# Patient Record
Sex: Female | Born: 1955 | Race: White | Hispanic: No | Marital: Single | Smoking: Never smoker
Health system: Southern US, Community
[De-identification: ages and names within clinical notes are randomized; demographics above are authoritative.]

## PROBLEM LIST (undated history)

## (undated) DIAGNOSIS — J45909 Unspecified asthma, uncomplicated: Secondary | ICD-10-CM

## (undated) DIAGNOSIS — Z87898 Personal history of other specified conditions: Secondary | ICD-10-CM

## (undated) DIAGNOSIS — E119 Type 2 diabetes mellitus without complications: Secondary | ICD-10-CM

## (undated) DIAGNOSIS — J45990 Exercise induced bronchospasm: Secondary | ICD-10-CM

## (undated) DIAGNOSIS — Z859 Personal history of malignant neoplasm, unspecified: Secondary | ICD-10-CM

## (undated) DIAGNOSIS — Z973 Presence of spectacles and contact lenses: Secondary | ICD-10-CM

## (undated) DIAGNOSIS — E7800 Pure hypercholesterolemia, unspecified: Secondary | ICD-10-CM

## (undated) DIAGNOSIS — D259 Leiomyoma of uterus, unspecified: Secondary | ICD-10-CM

## (undated) DIAGNOSIS — N201 Calculus of ureter: Secondary | ICD-10-CM

## (undated) DIAGNOSIS — Z87820 Personal history of traumatic brain injury: Secondary | ICD-10-CM

## (undated) HISTORY — DX: Pure hypercholesterolemia, unspecified: E78.00

## (undated) HISTORY — DX: Unspecified asthma, uncomplicated: J45.909

## (undated) HISTORY — DX: Type 2 diabetes mellitus without complications: E11.9

---

## 2005-02-10 HISTORY — PX: OTHER SURGICAL HISTORY: SHX169

## 2007-07-13 ENCOUNTER — Encounter: Admission: RE | Admit: 2007-07-13 | Discharge: 2007-08-17 | Payer: Self-pay | Admitting: Orthopedic Surgery

## 2012-03-21 ENCOUNTER — Emergency Department (HOSPITAL_COMMUNITY): Payer: 59

## 2012-03-21 ENCOUNTER — Emergency Department (HOSPITAL_COMMUNITY): Payer: 59 | Admitting: Anesthesiology

## 2012-03-21 ENCOUNTER — Encounter (HOSPITAL_COMMUNITY): Payer: Self-pay | Admitting: Emergency Medicine

## 2012-03-21 ENCOUNTER — Encounter (HOSPITAL_COMMUNITY): Admission: EM | Disposition: A | Discharge status: Home / Self Care | Payer: Self-pay | Source: Home / Self Care

## 2012-03-21 ENCOUNTER — Emergency Department (HOSPITAL_COMMUNITY)
Admission: EM | Admit: 2012-03-21 | Discharge: 2012-03-21 | Disposition: A | Discharge status: Nursing Home (Includes ICF) | Payer: PRIVATE HEALTH INSURANCE | Source: Home / Self Care

## 2012-03-21 ENCOUNTER — Encounter (HOSPITAL_COMMUNITY): Payer: Self-pay | Admitting: Anesthesiology

## 2012-03-21 ENCOUNTER — Inpatient Hospital Stay (HOSPITAL_COMMUNITY)
Admission: EM | Admit: 2012-03-21 | Discharge: 2012-03-23 | DRG: 340 | Disposition: A | Discharge status: Home / Self Care | Payer: 59 | Attending: Surgery | Admitting: Surgery

## 2012-03-21 ENCOUNTER — Encounter (HOSPITAL_COMMUNITY): Payer: Self-pay | Admitting: Nurse Practitioner

## 2012-03-21 DIAGNOSIS — K3580 Unspecified acute appendicitis: Secondary | ICD-10-CM

## 2012-03-21 DIAGNOSIS — K352 Acute appendicitis with generalized peritonitis, without abscess: Principal | ICD-10-CM | POA: Diagnosis present

## 2012-03-21 DIAGNOSIS — K3532 Acute appendicitis with perforation and localized peritonitis, without abscess: Secondary | ICD-10-CM

## 2012-03-21 DIAGNOSIS — R1031 Right lower quadrant pain: Secondary | ICD-10-CM

## 2012-03-21 DIAGNOSIS — K35209 Acute appendicitis with generalized peritonitis, without abscess, unspecified as to perforation: Principal | ICD-10-CM | POA: Diagnosis present

## 2012-03-21 HISTORY — PX: LAPAROSCOPIC APPENDECTOMY: SHX408

## 2012-03-21 LAB — CBC WITH DIFFERENTIAL/PLATELET
Basophils Absolute: 0 10*3/uL (ref 0.0–0.1)
Basophils Relative: 0 % (ref 0–1)
MCHC: 34.9 g/dL (ref 30.0–36.0)
Monocytes Absolute: 1.1 10*3/uL — ABNORMAL HIGH (ref 0.1–1.0)
Neutro Abs: 18.7 10*3/uL — ABNORMAL HIGH (ref 1.7–7.7)
Neutrophils Relative %: 89 % — ABNORMAL HIGH (ref 43–77)
RDW: 13.2 % (ref 11.5–15.5)

## 2012-03-21 LAB — POCT URINALYSIS DIP (DEVICE)
Glucose, UA: NEGATIVE mg/dL
Specific Gravity, Urine: >=1.03 (ref 1.005–1.030)

## 2012-03-21 LAB — COMPREHENSIVE METABOLIC PANEL
Alkaline Phosphatase: 78 U/L (ref 39–117)
BUN: 18 mg/dL (ref 6–23)
Chloride: 99 mEq/L (ref 96–112)
GFR calc Af Amer: 79 mL/min — ABNORMAL LOW (ref >90)
GFR calc non Af Amer: 68 mL/min — ABNORMAL LOW (ref >90)
Glucose, Bld: 140 mg/dL — ABNORMAL HIGH (ref 70–99)
Potassium: 3.9 mEq/L (ref 3.5–5.1)
Total Bilirubin: 0.7 mg/dL (ref 0.3–1.2)

## 2012-03-21 LAB — LIPASE, BLOOD: Lipase: 17 U/L (ref 11–59)

## 2012-03-21 SURGERY — APPENDECTOMY, LAPAROSCOPIC
Anesthesia: General | Site: Abdomen | Wound class: Dirty or Infected

## 2012-03-21 MED ORDER — MIDAZOLAM HCL 5 MG/5ML IJ SOLN
INTRAMUSCULAR | Status: DC | PRN
  Administered 2012-03-21: 2 mg via INTRAVENOUS

## 2012-03-21 MED ORDER — KETOROLAC TROMETHAMINE 30 MG/ML IJ SOLN
INTRAMUSCULAR | Status: DC | PRN
  Administered 2012-03-21: 30 mg via INTRAVENOUS

## 2012-03-21 MED ORDER — SODIUM CHLORIDE 0.9 % IR SOLN
Status: DC | PRN
  Administered 2012-03-21: 1000 mL

## 2012-03-21 MED ORDER — KCL IN DEXTROSE-NACL 20-5-0.9 MEQ/L-%-% IV SOLN
INTRAVENOUS | Status: DC
  Administered 2012-03-21 – 2012-03-23 (×4): via INTRAVENOUS
  Filled 2012-03-21 (×6): qty 1000

## 2012-03-21 MED ORDER — LIDOCAINE HCL (CARDIAC) 20 MG/ML IV SOLN
INTRAVENOUS | Status: DC | PRN
  Administered 2012-03-21: 100 mg via INTRAVENOUS

## 2012-03-21 MED ORDER — SUCCINYLCHOLINE CHLORIDE 20 MG/ML IJ SOLN
INTRAMUSCULAR | Status: DC | PRN
  Administered 2012-03-21: 120 mg via INTRAVENOUS

## 2012-03-21 MED ORDER — ONDANSETRON HCL 4 MG/2ML IJ SOLN: 4.0000 mg | Freq: Four times a day (QID) | INTRAMUSCULAR | Status: DC | PRN

## 2012-03-21 MED ORDER — BUPIVACAINE-EPINEPHRINE 0.25% -1:200000 IJ SOLN
INTRAMUSCULAR | Status: DC | PRN
  Administered 2012-03-21: 20 mL

## 2012-03-21 MED ORDER — FENTANYL CITRATE 0.05 MG/ML IJ SOLN
INTRAMUSCULAR | Status: DC | PRN
  Administered 2012-03-21: 150 ug via INTRAVENOUS
  Administered 2012-03-21: 50 ug via INTRAVENOUS
  Administered 2012-03-21: 100 ug via INTRAVENOUS
  Administered 2012-03-21: 50 ug via INTRAVENOUS

## 2012-03-21 MED ORDER — OXYCODONE HCL 5 MG/5ML PO SOLN: 5.0000 mg | Freq: Once | ORAL | Status: DC | PRN

## 2012-03-21 MED ORDER — PIPERACILLIN-TAZOBACTAM 3.375 G IVPB
3.3750 g | Freq: Once | INTRAVENOUS | Status: AC
  Administered 2012-03-21: 3.375 g via INTRAVENOUS
  Filled 2012-03-21: qty 50

## 2012-03-21 MED ORDER — SODIUM CHLORIDE 0.9 % IV SOLN
Freq: Once | INTRAVENOUS | Status: AC
  Administered 2012-03-21: 16:00:00 via INTRAVENOUS

## 2012-03-21 MED ORDER — PROMETHAZINE HCL 25 MG/ML IJ SOLN: 6.2500 mg | INTRAMUSCULAR | Status: DC | PRN

## 2012-03-21 MED ORDER — ONDANSETRON HCL 4 MG/2ML IJ SOLN
INTRAMUSCULAR | Status: DC | PRN
  Administered 2012-03-21: 4 mg via INTRAVENOUS

## 2012-03-21 MED ORDER — MORPHINE SULFATE 4 MG/ML IJ SOLN: 4.0000 mg | INTRAMUSCULAR | Status: DC | PRN

## 2012-03-21 MED ORDER — METOCLOPRAMIDE HCL 5 MG/ML IJ SOLN
INTRAMUSCULAR | Status: DC | PRN
  Administered 2012-03-21: 10 mg via INTRAVENOUS

## 2012-03-21 MED ORDER — PROPOFOL 10 MG/ML IV BOLUS
INTRAVENOUS | Status: DC | PRN
  Administered 2012-03-21: 160 mg via INTRAVENOUS

## 2012-03-21 MED ORDER — ONDANSETRON HCL 4 MG/2ML IJ SOLN
4.0000 mg | Freq: Once | INTRAMUSCULAR | Status: AC
  Administered 2012-03-21: 4 mg via INTRAVENOUS
  Filled 2012-03-21: qty 2

## 2012-03-21 MED ORDER — VECURONIUM BROMIDE 10 MG IV SOLR
INTRAVENOUS | Status: DC | PRN
  Administered 2012-03-21: 4 mg via INTRAVENOUS

## 2012-03-21 MED ORDER — HYDROMORPHONE HCL PF 1 MG/ML IJ SOLN
INTRAMUSCULAR | Status: AC
  Filled 2012-03-21: qty 1

## 2012-03-21 MED ORDER — IOHEXOL 300 MG/ML  SOLN
100.0000 mL | Freq: Once | INTRAMUSCULAR | Status: AC | PRN
  Administered 2012-03-21: 100 mL via INTRAVENOUS

## 2012-03-21 MED ORDER — OXYCODONE HCL 5 MG PO TABS: 5.0000 mg | ORAL_TABLET | Freq: Once | ORAL | Status: DC | PRN

## 2012-03-21 MED ORDER — LACTATED RINGERS IV SOLN
INTRAVENOUS | Status: DC | PRN
  Administered 2012-03-21 (×2): via INTRAVENOUS

## 2012-03-21 MED ORDER — ARTIFICIAL TEARS OP OINT
TOPICAL_OINTMENT | OPHTHALMIC | Status: DC | PRN
  Administered 2012-03-21: 1 via OPHTHALMIC

## 2012-03-21 MED ORDER — GLYCOPYRROLATE 0.2 MG/ML IJ SOLN
INTRAMUSCULAR | Status: DC | PRN
  Administered 2012-03-21: 0.6 mg via INTRAVENOUS

## 2012-03-21 MED ORDER — PIPERACILLIN-TAZOBACTAM 3.375 G IVPB
3.3750 g | Freq: Three times a day (TID) | INTRAVENOUS | Status: DC
  Administered 2012-03-22 – 2012-03-23 (×5): 3.375 g via INTRAVENOUS
  Filled 2012-03-21 (×7): qty 50

## 2012-03-21 MED ORDER — IOHEXOL 300 MG/ML  SOLN
50.0000 mL | Freq: Once | INTRAMUSCULAR | Status: AC | PRN
  Administered 2012-03-21: 50 mL via ORAL

## 2012-03-21 MED ORDER — HYDROMORPHONE HCL PF 1 MG/ML IJ SOLN
0.2500 mg | INTRAMUSCULAR | Status: DC | PRN
  Administered 2012-03-21: 0.5 mg via INTRAVENOUS

## 2012-03-21 MED ORDER — ONDANSETRON HCL 4 MG PO TABS: 4.0000 mg | ORAL_TABLET | Freq: Four times a day (QID) | ORAL | Status: DC | PRN

## 2012-03-21 MED ORDER — DEXAMETHASONE SODIUM PHOSPHATE 4 MG/ML IJ SOLN
INTRAMUSCULAR | Status: DC | PRN
  Administered 2012-03-21: 10 mg via INTRAVENOUS

## 2012-03-21 MED ORDER — NEOSTIGMINE METHYLSULFATE 1 MG/ML IJ SOLN
INTRAMUSCULAR | Status: DC | PRN
  Administered 2012-03-21: 5 mg via INTRAVENOUS

## 2012-03-21 SURGICAL SUPPLY — 39 items
APPLIER CLIP ROT 10 11.4 M/L (STAPLE)
BLADE SURG ROTATE 9660 (MISCELLANEOUS) ×2 IMPLANT
CANISTER SUCTION 2500CC (MISCELLANEOUS) ×2 IMPLANT
CHLORAPREP W/TINT 26ML (MISCELLANEOUS) ×2 IMPLANT
CLIP APPLIE ROT 10 11.4 M/L (STAPLE) IMPLANT
CLOTH BEACON ORANGE TIMEOUT ST (SAFETY) ×2 IMPLANT
COVER SURGICAL LIGHT HANDLE (MISCELLANEOUS) ×2 IMPLANT
CUTTER FLEX LINEAR 45M (STAPLE) ×2 IMPLANT
DECANTER SPIKE VIAL GLASS SM (MISCELLANEOUS) ×2 IMPLANT
DERMABOND ADVANCED (GAUZE/BANDAGES/DRESSINGS) ×1
DERMABOND ADVANCED .7 DNX12 (GAUZE/BANDAGES/DRESSINGS) ×1 IMPLANT
DRAPE UTILITY 15X26 W/TAPE STR (DRAPE) ×4 IMPLANT
ELECT REM PT RETURN 9FT ADLT (ELECTROSURGICAL) ×2
ELECTRODE REM PT RTRN 9FT ADLT (ELECTROSURGICAL) ×1 IMPLANT
ENDOLOOP SUT PDS II  0 18 (SUTURE)
ENDOLOOP SUT PDS II 0 18 (SUTURE) IMPLANT
GLOVE BIO SURGEON STRL SZ7.5 (GLOVE) ×8 IMPLANT
GLOVE BIO SURGEON STRL SZ8 (GLOVE) ×2 IMPLANT
GLOVE BIOGEL PI IND STRL 8 (GLOVE) ×1 IMPLANT
GLOVE BIOGEL PI INDICATOR 8 (GLOVE) ×1
GOWN STRL NON-REIN LRG LVL3 (GOWN DISPOSABLE) ×2 IMPLANT
GOWN STRL REIN 3XL XLG LVL4 (GOWN DISPOSABLE) ×4 IMPLANT
KIT BASIN OR (CUSTOM PROCEDURE TRAY) ×2 IMPLANT
KIT ROOM TURNOVER OR (KITS) ×2 IMPLANT
NS IRRIG 1000ML POUR BTL (IV SOLUTION) ×2 IMPLANT
PAD ARMBOARD 7.5X6 YLW CONV (MISCELLANEOUS) ×4 IMPLANT
POUCH SPECIMEN RETRIEVAL 10MM (ENDOMECHANICALS) ×2 IMPLANT
RELOAD STAPLE TA45 3.5 REG BLU (ENDOMECHANICALS) ×2 IMPLANT
SCALPEL HARMONIC ACE (MISCELLANEOUS) ×2 IMPLANT
SET IRRIG TUBING LAPAROSCOPIC (IRRIGATION / IRRIGATOR) ×2 IMPLANT
SPECIMEN JAR SMALL (MISCELLANEOUS) ×2 IMPLANT
SUT MNCRL AB 4-0 PS2 18 (SUTURE) ×2 IMPLANT
TOWEL OR 17X24 6PK STRL BLUE (TOWEL DISPOSABLE) ×2 IMPLANT
TOWEL OR 17X26 10 PK STRL BLUE (TOWEL DISPOSABLE) ×2 IMPLANT
TRAY FOLEY CATH 14FR (SET/KITS/TRAYS/PACK) ×2 IMPLANT
TRAY LAPAROSCOPIC (CUSTOM PROCEDURE TRAY) ×2 IMPLANT
TROCAR XCEL BLUNT TIP 100MML (ENDOMECHANICALS) ×2 IMPLANT
TROCAR XCEL NON-BLD 5MMX100MML (ENDOMECHANICALS) ×4 IMPLANT
WATER STERILE IRR 1000ML POUR (IV SOLUTION) IMPLANT

## 2012-03-21 NOTE — ED Notes (Signed)
Pt c/o epigastric pain that started on Friday after eating lunch which has subsided. Pt is now having pain in the RLQ that is constant and sharp, pain is felt with movement. Pt has tried resting but still having pain. Some nausea.  Pt denies vomiting and diarrhea.

## 2012-03-21 NOTE — ED Notes (Signed)
OR called; Dr. Carolynne Edouard on way to see patient at this time.  Patient currently sitting up in bed; no respiratory or acute distress noted.  Patient updated on plan of care; informed patient that Dr. Carolynne Edouard on way to see patient.  Denies any needs at this time; will continue to monitor.

## 2012-03-21 NOTE — ED Notes (Signed)
Patient currently resting quietly in bed; no respiratory or acute distress noted.  Patient finished drinking second water/CT cup.  CT called and notified.  Patient pending transport to CT.  Will continue to monitor.

## 2012-03-21 NOTE — Anesthesia Postprocedure Evaluation (Signed)
Anesthesia Post Note  Patient: Tara Glover  Procedure(s) Performed: Procedure(s) (LRB): APPENDECTOMY LAPAROSCOPIC (N/A)  Anesthesia type: general  Patient location: PACU  Post pain: Pain level controlled  Post assessment: Patient's Cardiovascular Status Stable  Last Vitals:  Filed Vitals:   03/21/12 2130  BP: 136/59  Pulse: 71  Temp:   Resp: 28    Post vital signs: Reviewed and stable  Level of consciousness: sedated  Complications: No apparent anesthesia complications

## 2012-03-21 NOTE — Anesthesia Preprocedure Evaluation (Addendum)
Anesthesia Evaluation  Patient identified by MRN, date of birth, ID band Patient awake    Reviewed: Allergy & Precautions, H&P , NPO status , Patient's Chart, lab work & pertinent test results  History of Anesthesia Complications Negative for: history of anesthetic complications  Airway Mallampati: II TM Distance: >3 FB     Dental  (+) Dental Advisory Given   Pulmonary neg pulmonary ROS,    Pulmonary exam normal       Cardiovascular negative cardio ROS  Rhythm:Regular Rate:Normal     Neuro/Psych negative neurological ROS  negative psych ROS   GI/Hepatic negative GI ROS, Neg liver ROS,   Endo/Other  negative endocrine ROS  Renal/GU negative Renal ROS     Musculoskeletal   Abdominal   Peds  Hematology   Anesthesia Other Findings   Reproductive/Obstetrics                        Anesthesia Physical Anesthesia Plan  ASA: II and emergent  Anesthesia Plan: General   Post-op Pain Management:    Induction: Rapid sequence and Cricoid pressure planned  Airway Management Planned: Oral ETT  Additional Equipment:   Intra-op Plan:   Post-operative Plan: Extubation in OR  Informed Consent: I have reviewed the patients History and Physical, chart, labs and discussed the procedure including the risks, benefits and alternatives for the proposed anesthesia with the patient or authorized representative who has indicated his/her understanding and acceptance.   Dental advisory given  Plan Discussed with: CRNA, Anesthesiologist and Surgeon  Anesthesia Plan Comments:        Anesthesia Quick Evaluation

## 2012-03-21 NOTE — ED Notes (Signed)
Dr Toth at bedside. 

## 2012-03-21 NOTE — Preoperative (Signed)
Beta Blockers   Reason not to administer Beta Blockers:Not Applicable 

## 2012-03-21 NOTE — ED Notes (Signed)
Patient has finished drinking one contrast/water cup at this time; patient's IV started, nausea medications given.  Patient instructed to notify staff as soon as she finishes second cup.  Denies any other needs at this time; will continue to monitor.

## 2012-03-21 NOTE — ED Notes (Signed)
IV team unable to get IV access; another IV team member paged and is on way.

## 2012-03-21 NOTE — ED Notes (Signed)
IV team currently at bedside attempting IV start. 

## 2012-03-21 NOTE — ED Notes (Signed)
C/o epigastric pain since Friday that has moved into RLQ. Felt fatigued and loss of appetite since yesterday

## 2012-03-21 NOTE — ED Notes (Addendum)
Received bedside report from Ramona, Charity fundraiser.  Patient currently sitting up in bed; no respiratory or acute distress noted.  Patient updated on plan of care; informed patient that previous RN has called IV team for IV start.  Patient denies any other needs at this time; will continue to monitor.

## 2012-03-21 NOTE — ED Notes (Signed)
Patient being transported to OR 

## 2012-03-21 NOTE — H&P (Signed)
Tara Glover is an 57 y.o. female.   Chief Complaint: abdominal pain HPI: 57 yo wf who began having abdominal pain on Friday afternoon. Pain was high initially then moved to RLQ. Pain did not go away so she came to ER. CT shows appendicitis with possible perf. She denies any fever or diarrhea  History reviewed. No pertinent past medical history.  Past Surgical History  Procedure Laterality Date  . Knee surgery      History reviewed. No pertinent family history. Social History:  reports that she has never smoked. She does not have any smokeless tobacco history on file. She reports that she does not drink alcohol or use illicit drugs.  Allergies: No Known Allergies   (Not in a hospital admission)  Results for orders placed during the hospital encounter of 03/21/12 (from the past 48 hour(s))  COMPREHENSIVE METABOLIC PANEL     Status: Abnormal   Collection Time    03/21/12 12:47 PM      Result Value Range   Sodium 137  135 - 145 mEq/L   Potassium 3.9  3.5 - 5.1 mEq/L   Chloride 99  96 - 112 mEq/L   CO2 25  19 - 32 mEq/L   Glucose, Bld 140 (*) 70 - 99 mg/dL   BUN 18  6 - 23 mg/dL   Creatinine, Ser 1.61  0.50 - 1.10 mg/dL   Calcium 9.5  8.4 - 09.6 mg/dL   Total Protein 8.0  6.0 - 8.3 g/dL   Albumin 3.8  3.5 - 5.2 g/dL   AST 13  0 - 37 U/L   ALT 12  0 - 35 U/L   Alkaline Phosphatase 78  39 - 117 U/L   Total Bilirubin 0.7  0.3 - 1.2 mg/dL   GFR calc non Af Amer 68 (*) >90 mL/min   GFR calc Af Amer 79 (*) >90 mL/min   Comment:            The eGFR has been calculated     using the CKD EPI equation.     This calculation has not been     validated in all clinical     situations.     eGFR's persistently     <90 mL/min signify     possible Chronic Kidney Disease.  LIPASE, BLOOD     Status: None   Collection Time    03/21/12 12:47 PM      Result Value Range   Lipase 17  11 - 59 U/L  CBC WITH DIFFERENTIAL     Status: Abnormal   Collection Time    03/21/12 12:47 PM      Result  Value Range   WBC 21.0 (*) 4.0 - 10.5 K/uL   RBC 4.72  3.87 - 5.11 MIL/uL   Hemoglobin 14.1  12.0 - 15.0 g/dL   HCT 04.5  40.9 - 81.1 %   MCV 85.6  78.0 - 100.0 fL   MCH 29.9  26.0 - 34.0 pg   MCHC 34.9  30.0 - 36.0 g/dL   RDW 91.4  78.2 - 95.6 %   Platelets 271  150 - 400 K/uL   Neutrophils Relative 89 (*) 43 - 77 %   Neutro Abs 18.7 (*) 1.7 - 7.7 K/uL   Lymphocytes Relative 6 (*) 12 - 46 %   Lymphs Abs 1.2  0.7 - 4.0 K/uL   Monocytes Relative 5  3 - 12 %   Monocytes Absolute 1.1 (*) 0.1 -  1.0 K/uL   Eosinophils Relative 0  0 - 5 %   Eosinophils Absolute 0.0  0.0 - 0.7 K/uL   Basophils Relative 0  0 - 1 %   Basophils Absolute 0.0  0.0 - 0.1 K/uL   Ct Abdomen Pelvis W Contrast  03/21/2012  *RADIOLOGY REPORT*  Clinical Data: Right abdominal pain with nausea and loss of appetite for 2 days.  CT ABDOMEN AND PELVIS WITH CONTRAST  Technique:  Multidetector CT imaging of the abdomen and pelvis was performed following the standard protocol during bolus administration of intravenous contrast.  Contrast: OMNIPAQUE IOHEXOL 300 MG/ML  SOLN  Comparison: None.  Findings: Mild atelectasis is present in both lung bases.  There is no significant pleural effusion.  There are several hepatic cysts, the largest posteriorly in the right lobe, measuring 3.5 cm on image 25.  There are no suspicious liver lesions.  The gallbladder, biliary system, pancreas and spleen appear normal.  There is no adrenal mass.  Several low density renal lesions bilaterally are likely cysts.  There are two small nonobstructing calculi in the upper pole of the left kidney. There is no hydronephrosis.  There is an extensive inflammatory process in the right lower quadrant associated with a markedly inflamed appendix.  The appendix is distended to 11 mm and demonstrates diffuse wall thickening.  There is a small amount of extraluminal air on image 60 consistent with perforation.  There is surrounding ill-defined fluid, but no well  defined fluid collection.  There is no evidence of bowel obstruction.  There is no evidence of pelvic mass, fluid collection or inflammatory process.  The uterus, ovaries and bladder appear normal.  There are no worrisome osseous findings.  IMPRESSION:  1.  Perforated acute appendicitis.    No drainable abscess. 2.  Nonobstructing calculi in the upper pole of the left kidney. 3.  Hepatic and renal cysts.  Critical Value/emergent results were called by telephone at the time of interpretation on 03/21/2012 at 1800 hours to Dr. Anitra Lauth, who verbally acknowledged these results.   Original Report Authenticated By: Carey Bullocks, M.D.     Review of Systems  Constitutional: Negative.   HENT: Negative.   Eyes: Negative.   Respiratory: Negative.   Cardiovascular: Negative.   Gastrointestinal: Positive for abdominal pain.  Genitourinary: Negative.   Musculoskeletal: Negative.   Skin: Negative.   Neurological: Negative.   Endo/Heme/Allergies: Negative.   Psychiatric/Behavioral: Negative.     Blood pressure 130/75, pulse 80, temperature 99.2 F (37.3 C), temperature source Oral, resp. rate 16, SpO2 97.00%. Physical Exam  Constitutional: She is oriented to person, place, and time. She appears well-developed and well-nourished.  HENT:  Head: Normocephalic and atraumatic.  Eyes: Conjunctivae and EOM are normal. Pupils are equal, round, and reactive to light.  Neck: Normal range of motion. Neck supple.  Cardiovascular: Normal rate, regular rhythm and normal heart sounds.   Respiratory: Effort normal and breath sounds normal.  GI: Soft.  Focal RLQ pain with guarding  Musculoskeletal: Normal range of motion.  Neurological: She is alert and oriented to person, place, and time.  Skin: Skin is warm and dry.  Psychiatric: She has a normal mood and affect. Her behavior is normal.     Assessment/Plan Appendicitis with possible perforation. Because of the risk of sepsis i think she would benefit from  having her appendix removed tonight. I have discussed with her the risk and benefits of surgery as well as some of the technical aspects and she  understands and wishes to proceed  TOTH III,Yasmen Cortner S 03/21/2012, 7:31 PM

## 2012-03-21 NOTE — ED Provider Notes (Signed)
Tara Glover is a 56 y.o. female who presents to Urgent Care today for right lower corner pain starting Friday worsening today. Pain is described as sharp, and worse with motion. She's tried resting however it has not improved. She denies any nausea vomiting diarrhea or constipation. Her last bowel movement was normal and yesterday. She denies any vaginal bleeding or discharge. Additionally she denies any change in her urination. She denies blood in her stool or black tarry stools.    No personal history of colon cancer screening.   PMH reviewed. No history of abdominal surgery History  Substance Use Topics  . Smoking status: Never Smoker   . Smokeless tobacco: Not on file  . Alcohol Use: No   ROS as above Medications reviewed. No current facility-administered medications for this encounter.   No current outpatient prescriptions on file.    Exam:  BP 116/67  Pulse 77  Temp(Src) 98.4 F (36.9 C) (Oral)  Resp 18  SpO2 100% Gen: Well NAD HEENT: EOMI,  MMM Lungs: CTABL Nl WOB Heart: RRR no MRG Abd: Hypoactive bowel sounds. Tender palpation right lower corner. Involuntary guarding in right lower corner. No rebound.  Nontender other quadrant.  Pelvic: No cervical motion tenderness no discharge no large adnexal masses Exts: Non edematous BL  LE, warm and well perfused.   No results found for this or any previous visit (from the past 24 hour(s)). No results found.  Assessment and Plan: 57 y.o. female with right lower corner and abdominal pain tender to palpation.  Suspicious for appendicitis.  Plan: Transfer to ER for further evaluation and management. Likely CT scan.  Patient expresses understanding and agreement.     Rodolph Bong, MD 03/21/12 [phone]

## 2012-03-21 NOTE — Op Note (Signed)
03/21/2012  9:06 PM  PATIENT:  Tara Glover  57 y.o. female  PRE-OPERATIVE DIAGNOSIS:  ruptured appendix  POST-OPERATIVE DIAGNOSIS:  ruptured appendix  PROCEDURE:  Procedure(s): APPENDECTOMY LAPAROSCOPIC (N/A)  SURGEON:  Surgeon(s) and Role:    * Robyne Askew, MD - Primary  PHYSICIAN ASSISTANT:   ASSISTANTS: none   ANESTHESIA:   general  EBL:  Total I/O In: 1000 [I.V.:1000] Out: -   BLOOD ADMINISTERED:none  DRAINS: none   LOCAL MEDICATIONS USED:  MARCAINE     SPECIMEN:  Source of Specimen:  appendix  DISPOSITION OF SPECIMEN:  PATHOLOGY  COUNTS:  YES  TOURNIQUET:  * No tourniquets in log *  DICTATION: .Dragon Dictation After informed consent was obtained patient was brought to the operating room placed in the supine position on the operating room table. After adequate induction of general anesthesia the patient's abdomen was prepped with ChloraPrep, allowed to dry, and draped in usual sterile manner. The area below the umbilicus was infiltrated with quarter percent Marcaine. A small incision was made with a 15 blade knife. This incision was carried down through the subcutaneous tissue bluntly with a hemostat and [employer] retractors until the linea alba was identified. The linea alba was incised with a 15 blade knife. Each side was grasped Coker clamps and elevated anteriorly. The preperitoneal space was probed bluntly with a hemostat until the peritoneum was opened and access was gained to the abdominal cavity. A 0 Vicryl purse string stitch was placed in the fascia surrounding the opening. A Hassan cannula was placed through the opening and anchored in place with the previously placed Vicryl purse string stitch. The laparoscope was placed through the [facility] cannula. The abdomen was insufflated with carbon dioxide without difficulty. nnext the suprapubic area was infiltrated with quarter percent Marcaine. A small incision was made with a 15 blade knife. A 5 mm port was placed  bluntly through this incision into the abdominal cavity. A site was then chosen between the 2 port for placement of a 5 mm port. The area was infiltrated with quarter percent Marcaine. A small stab incision was made with a 15 blade knife. A 5 mm port was placed bluntly through this incision and the abdominal cavity under direct vision. The laparoscope was then moved to the suprapubic port. Using a Glassman grasper and harmonic scalpel the right lower quadrant was inspected. The appendix was readily identified. The appendix was elevated anteriorly and the mesoappendix was taken down sharply with the harmonic scalpel. Once the base of the appendix where it joined the cecum was identified and cleared of any tissue then a laparoscopic GIA blue load 6 row stapler was placed through the [facility] cannula. The stapler was placed across the base of the appendix clamped and fired thereby dividing the base of the appendix between staple lines. A laparoscopic bag was then inserted through the [facility] cannula. The appendix was placed within the bag and the bag was sealed. The abdomen was then irrigated with copious amounts of saline until the effluent was clear. No other abnormalities were noted. The appendix and bag were removed with the [facility] cannula through the infraumbilical port without difficulty. The fascial defect was closed with the previously placed Vicryl pursestring stitch as well as with another interrupted 0 Vicryl figure-of-eight stitch. The rest of the ports were removed under direct vision and were found to be hemostatic. The gas was allowed to escape. The skin incisions were closed with interrupted 4-0 Monocryl subcuticular stitches. Dermabond dressings  were applied. The patient tolerated the procedure well. At the end of the case all needle sponge and instrument counts were correct. The patient was then awakened and taken to recovery in stable condition.  PLAN OF CARE: Admit to inpatient   PATIENT  DISPOSITION:  PACU - hemodynamically stable.   Delay start of Pharmacological VTE agent (>24hrs) due to surgical blood loss or risk of bleeding: yes

## 2012-03-21 NOTE — ED Provider Notes (Signed)
Pt with ruptured appy without defined abscess. Patient started on Zosyn.  Discussed with Dr. Carolynne Edouard general surgery and he will come and evaluate the patient  Gwyneth Sprout, MD 03/21/12 [phone]

## 2012-03-21 NOTE — ED Notes (Signed)
OR called and stated they are ready for patient; preparing patient for transport.  Informed consent signature obtained; placed informed consent in patient's chart.

## 2012-03-21 NOTE — ED Notes (Signed)
Patient currently resting quietly in bed; no respiratory or acute distress noted.  Patient updated on plan of care; informed patient that EDP has made consult to general surgery.  Patient denies any other needs at this time; will continue to monitor.

## 2012-03-21 NOTE — ED Notes (Signed)
Attempted to establish PIV but unable to due to poor vein access

## 2012-03-21 NOTE — Transfer of Care (Signed)
Immediate Anesthesia Transfer of Care Note  Patient: Tara Glover  Procedure(s) Performed: Procedure(s): APPENDECTOMY LAPAROSCOPIC (N/A)  Patient Location: PACU  Anesthesia Type:General  Level of Consciousness: oriented, sedated, patient cooperative and responds to stimulation  Airway & Oxygen Therapy: Patient Spontanous Breathing and Patient connected to nasal cannula oxygen  Post-op Assessment: Report given to PACU RN, Post -op Vital signs reviewed and stable, Patient moving all extremities and Patient moving all extremities X 4  Post vital signs: Reviewed and stable  Complications: No apparent anesthesia complications

## 2012-03-21 NOTE — ED Provider Notes (Signed)
History     CSN: [id number]  Arrival date & time 03/21/12  1241   First MD Initiated Contact with Patient 03/21/12 1431      Chief Complaint  Patient presents with  . Abdominal Pain    (Consider location/radiation/quality/duration/timing/severity/associated sxs/prior treatment) HPI Comments: Patient presents with right lower quadrant pain for the past day. She first noticed discomfort in her epigastrium 2 days ago it was sharp worse with motion. Did not improve with rest. He moved to her right lower quadrant yesterday. She reports no nausea, vomiting diarrhea or constipation. Normal bowel movements. No urinary or vaginal symptoms. Pain is worse with palpation, better with rest. No previous abdominal surgeries.  The history is provided by the patient.    History reviewed. No pertinent past medical history.  Past Surgical History  Procedure Laterality Date  . Knee surgery      History reviewed. No pertinent family history.  History  Substance Use Topics  . Smoking status: Never Smoker   . Smokeless tobacco: Not on file  . Alcohol Use: No    OB History   Grav Para Term Preterm Abortions TAB SAB Ect Mult Living                  Review of Systems  Constitutional: Positive for activity change and appetite change. Negative for fever.  HENT: Negative for congestion and rhinorrhea.   Respiratory: Negative for cough, chest tightness and shortness of breath.   Cardiovascular: Negative for chest pain.  Gastrointestinal: Positive for abdominal pain. Negative for nausea, vomiting and diarrhea.  Genitourinary: Negative for dysuria, hematuria, vaginal bleeding and vaginal discharge.  Musculoskeletal: Negative for back pain.  Skin: Negative for rash.  A complete 10 system review of systems was obtained and all systems are negative except as noted in the HPI and PMH.    Allergies  Review of patient's allergies indicates no known allergies.  Home Medications  No current  outpatient prescriptions on file.  BP 138/70  Pulse 94  Temp(Src) 99.2 F (37.3 C) (Oral)  Resp 17  SpO2 98%  Physical Exam  Constitutional: She is oriented to person, place, and time. She appears well-developed and well-nourished. No distress.  HENT:  Head: Normocephalic and atraumatic.  Mouth/Throat: Oropharynx is clear and moist. No oropharyngeal exudate.  Eyes: Conjunctivae and EOM are normal. Pupils are equal, round, and reactive to light.  Neck: Normal range of motion. Neck supple.  Cardiovascular: Normal rate, regular rhythm and normal heart sounds.   No murmur heard. Pulmonary/Chest: Effort normal and breath sounds normal. No respiratory distress.  Abdominal: Soft. There is tenderness. There is rebound and guarding.  Right lower quadrant tenderness with guarding and rebound  Musculoskeletal: Normal range of motion. She exhibits no edema and no tenderness.  No CVA tenderness  Neurological: She is oriented to person, place, and time. No cranial nerve deficit. She exhibits normal muscle tone. Coordination normal.  Skin: Skin is warm.    ED Course  Procedures (including critical care time)  Labs Reviewed  COMPREHENSIVE METABOLIC PANEL - Abnormal; Notable for the following:    Glucose, Bld 140 (*)    GFR calc non Af Amer 68 (*)    GFR calc Af Amer 79 (*)    All other components within normal limits  CBC WITH DIFFERENTIAL - Abnormal; Notable for the following:    WBC 21.0 (*)    Neutrophils Relative 89 (*)    Neutro Abs 18.7 (*)    Lymphocytes Relative  6 (*)    Monocytes Absolute 1.1 (*)    All other components within normal limits  LIPASE, BLOOD   No results found.   No diagnosis found.    MDM  One day of abdominal pain with guarding and rebound. No nausea or vomiting. Concern for appendicitis based on exam.  Right lower quadrant pain with leukocytosis.  Suspect appendicitis. CT pending at time of signout to Dr. Anitra Lauth.     Glynn Octave,  MD 03/21/12 [phone]

## 2012-03-22 MED ORDER — CHLORHEXIDINE GLUCONATE 0.12 % MT SOLN
15.0000 mL | Freq: Two times a day (BID) | OROMUCOSAL | Status: DC
  Administered 2012-03-22 – 2012-03-23 (×3): 15 mL via OROMUCOSAL
  Filled 2012-03-22 (×3): qty 15

## 2012-03-22 MED ORDER — BIOTENE DRY MOUTH MT LIQD
15.0000 mL | Freq: Two times a day (BID) | OROMUCOSAL | Status: DC
  Administered 2012-03-22 (×2): 15 mL via OROMUCOSAL

## 2012-03-22 NOTE — Progress Notes (Signed)
Patient ID: Tara Glover, female   DOB: [birth year], 57 y.o.   MRN: [id number] 1 Day Post-Op  Subjective: Pt feels well today.  Tolerated clear liquids.  Minimal abdominal pain.  Objective: Vital signs in last 24 hours: Temp:  [98.3 F (36.8 C)-99.2 F (37.3 C)] 98.8 F (37.1 C) (02/10 1015) Pulse Rate:  [69-105] 82 (02/10 1015) Resp:  [16-28] 18 (02/10 1015) BP: (99-145)/(52-75) 101/57 mmHg (02/10 1015) SpO2:  [94 %-100 %] 95 % (02/10 1015) Last BM Date: 03/20/12  Intake/Output from previous day: 02/09 0701 - 02/10 0700 In: 2241.7 [P.O.:120; I.V.:2121.7] Out: 730 [Urine:700; Blood:30] Intake/Output this shift: Total I/O In: -  Out: 200 [Urine:200]  PE: Abd: soft, appropriately tender, +BS, ND, incisions c/d/i with dermabond  Lab Results:   Recent Labs  03/21/12 1247  WBC 21.0*  HGB 14.1  HCT 40.4  PLT 271   BMET  Recent Labs  03/21/12 1247  NA 137  K 3.9  CL 99  CO2 25  GLUCOSE 140*  BUN 18  CREATININE 0.92  CALCIUM 9.5   PT/INR No results found for this basename: LABPROT, INR,  in the last 72 hours CMP     Component Value Date/Time   NA 137 03/21/2012 1247   K 3.9 03/21/2012 1247   CL 99 03/21/2012 1247   CO2 25 03/21/2012 1247   GLUCOSE 140* 03/21/2012 1247   BUN 18 03/21/2012 1247   CREATININE 0.92 03/21/2012 1247   CALCIUM 9.5 03/21/2012 1247   PROT 8.0 03/21/2012 1247   ALBUMIN 3.8 03/21/2012 1247   AST 13 03/21/2012 1247   ALT 12 03/21/2012 1247   ALKPHOS 78 03/21/2012 1247   BILITOT 0.7 03/21/2012 1247   GFRNONAA 68* 03/21/2012 1247   GFRAA 79* 03/21/2012 1247   Lipase     Component Value Date/Time   LIPASE 17 03/21/2012 1247       Studies/Results: Ct Abdomen Pelvis W Contrast  03/21/2012  *RADIOLOGY REPORT*  Clinical Data: Right abdominal pain with nausea and loss of appetite for 2 days.  CT ABDOMEN AND PELVIS WITH CONTRAST  Technique:  Multidetector CT imaging of the abdomen and pelvis was performed following the standard protocol during bolus administration of  intravenous contrast.  Contrast: OMNIPAQUE IOHEXOL 300 MG/ML  SOLN  Comparison: None.  Findings: Mild atelectasis is present in both lung bases.  There is no significant pleural effusion.  There are several hepatic cysts, the largest posteriorly in the right lobe, measuring 3.5 cm on image 25.  There are no suspicious liver lesions.  The gallbladder, biliary system, pancreas and spleen appear normal.  There is no adrenal mass.  Several low density renal lesions bilaterally are likely cysts.  There are two small nonobstructing calculi in the upper pole of the left kidney. There is no hydronephrosis.  There is an extensive inflammatory process in the right lower quadrant associated with a markedly inflamed appendix.  The appendix is distended to 11 mm and demonstrates diffuse wall thickening.  There is a small amount of extraluminal air on image 60 consistent with perforation.  There is surrounding ill-defined fluid, but no well defined fluid collection.  There is no evidence of bowel obstruction.  There is no evidence of pelvic mass, fluid collection or inflammatory process.  The uterus, ovaries and bladder appear normal.  There are no worrisome osseous findings.  IMPRESSION:  1.  Perforated acute appendicitis.    No drainable abscess. 2.  Nonobstructing calculi in the upper pole of  the left kidney. 3.  Hepatic and renal cysts.  Critical Value/emergent results were called by telephone at the time of interpretation on 03/21/2012 at 1800 hours to Dr. Anitra Lauth, who verbally acknowledged these results.   Original Report Authenticated By: Carey Bullocks, M.D.     Anti-infectives: Anti-infectives   Start     Dose/Rate Route Frequency Ordered Stop   03/22/12 0200  piperacillin-tazobactam (ZOSYN) IVPB 3.375 g     3.375 g 12.5 mL/hr over 240 Minutes Intravenous Every 8 hours 03/21/12 2149     03/21/12 1815  piperacillin-tazobactam (ZOSYN) IVPB 3.375 g     3.375 g 12.5 mL/hr over 240 Minutes Intravenous  Once  03/21/12 1801 03/21/12 1900     Assessment/Plan  1. Acute perforated appendicitis, s/p lap appy - P. Toth - 03/21/2012  Plan: 1. Advance diet to bland. 2. Cont IV abx today.  If continues to do well, can likely go home tomorrow.    LOS: 1 day    OSBORNE,KELLY E 03/22/2012, 10:20 AM Pager: [phone]  Agree with above. She got a little short of breath walking around the hall, otherwise doing well.  Ovidio Kin, MD, [facility] Pager: [phone] Office phone:  [phone]

## 2012-03-23 ENCOUNTER — Encounter (HOSPITAL_COMMUNITY): Payer: Self-pay | Admitting: General Surgery

## 2012-03-23 MED ORDER — HYDROCODONE-ACETAMINOPHEN 5-325 MG PO TABS: 1.0000 | ORAL_TABLET | ORAL | Status: DC | PRN

## 2012-03-23 MED ORDER — ALBUTEROL SULFATE HFA 108 (90 BASE) MCG/ACT IN AERS: 2.0000 | INHALATION_SPRAY | Freq: Four times a day (QID) | RESPIRATORY_TRACT | Status: DC | PRN

## 2012-03-23 MED ORDER — ALBUTEROL SULFATE HFA 108 (90 BASE) MCG/ACT IN AERS
2.0000 | INHALATION_SPRAY | RESPIRATORY_TRACT | Status: DC | PRN
  Filled 2012-03-23: qty 6.7

## 2012-03-23 MED ORDER — AMOXICILLIN-POT CLAVULANATE 875-125 MG PO TABS: 1.0000 | ORAL_TABLET | Freq: Two times a day (BID) | ORAL | Status: DC

## 2012-03-23 NOTE — Discharge Instructions (Signed)
[facility] ______[facility] °LAPAROSCOPIC SURGERY: POST OP INSTRUCTIONS °Always review your discharge instruction sheet given to you by the facility where your surgery was performed. °IF YOU HAVE DISABILITY OR FAMILY LEAVE FORMS, YOU MUST BRING THEM TO THE OFFICE FOR PROCESSING.   °DO NOT GIVE THEM TO YOUR DOCTOR. ° °1. A prescription for pain medication may be given to you upon discharge.  Take your pain medication as prescribed, if needed.  If narcotic pain medicine is not needed, then you may take acetaminophen (Tylenol) or ibuprofen (Advil) as needed. °2. Take your usually prescribed medications unless otherwise directed. °3. If you need a refill on your pain medication, please contact your pharmacy.  They will contact our office to request authorization. Prescriptions will not be filled after 5pm or on week-ends. °4. You should follow a light diet the first few days after arrival home, such as soup and crackers, etc.  Be sure to include lots of fluids daily. °5. Most patients will experience some swelling and bruising in the area of the incisions.  Ice packs will help.  Swelling and bruising can take several days to resolve.  °6. It is common to experience some constipation if taking pain medication after surgery.  Increasing fluid intake and taking a stool softener (such as Colace) will usually help or prevent this problem from occurring.  A mild laxative (Milk of Magnesia or Miralax) should be taken according to package instructions if there are no bowel movements after 48 hours. °7. Unless discharge instructions indicate otherwise, you may remove your bandages 24-48 hours after surgery, and you may shower at that time.  You may have steri-strips (small skin tapes) in place directly over the incision.  These strips should be left on the skin for 7-10 days.  If your surgeon used skin glue on the incision, you may shower in 24 hours.  The glue will flake off over the next 2-3 weeks.  Any sutures or  staples will be removed at the office during your follow-up visit. °8. ACTIVITIES:  You may resume regular (light) daily activities beginning the next day--such as daily self-care, walking, climbing stairs--gradually increasing activities as tolerated.  You may have sexual intercourse when it is comfortable.  Refrain from any heavy lifting or straining until approved by your doctor. °a. You may drive when you are no longer taking prescription pain medication, you can comfortably wear a seatbelt, and you can safely maneuver your car and apply brakes. °b. RETURN TO WORK:  __________________________________________________________ °9. You should see your doctor in the office for a follow-up appointment approximately 2-3 weeks after your surgery.  Make sure that you call for this appointment within a day or two after you arrive home to insure a convenient appointment time. °10. OTHER INSTRUCTIONS: __________________________________________________________________________________________________________________________ __________________________________________________________________________________________________________________________ °WHEN TO CALL YOUR DOCTOR: °1. Fever over 101.0 °2. Inability to urinate °3. Continued bleeding from incision. °4. Increased pain, redness, or drainage from the incision. °5. Increasing abdominal pain ° °The clinic staff is available to answer your questions during regular business hours.  Please don’t hesitate to call and ask to speak to one of the nurses for clinical concerns.  If you have a medical emergency, go to the nearest emergency room or call 911.  A surgeon from [facility] is always on call at the hospital. °[address], [address], [place], [place]  [address] ? [address] [address], [place], [place]   [address] °[phone] ? [phone] ? FAX [phone] °Web site:   [email or web address] °

## 2012-03-23 NOTE — Discharge Summary (Signed)
  Physician Discharge Summary  Patient ID: Tara Glover MRN: [id number] DOB/AGE: Apr 20, 1955 57 y.o.  Admit date: 03/21/2012 Discharge date: 03/23/2012  Admitting Diagnosis: Acute Appendicitis  Discharge Diagnosis Acute ruptured Appendicitis  Consultants None  Procedures Laparoscopic Appendectomy  Hospital Course 56 yr old female who presented to [facility] with abdominal pain.  Workup showed appendicitis.  Patient was admitted and underwent procedure listed above.  She was found to have ruptured appendicitis.  Tolerated procedure well and was transferred to the floor.  Diet was advanced as tolerated.  On POD#2, the patient was voiding well, tolerating diet, ambulating well, pain well controlled, vital signs stable, incisions c/d/i and felt stable for discharge home.  She will be discharged on antibiotics to ensure 7 day coverage including hospital IV abx.  Patient will follow up in our office in 2 weeks and knows to call with questions or concerns.    Medication List    TAKE these medications       albuterol 108 (90 BASE) MCG/ACT inhaler  Commonly known as:  PROVENTIL HFA;VENTOLIN HFA  Inhale 2 puffs into the lungs every 6 (six) hours as needed for wheezing.     amoxicillin-clavulanate 875-125 MG per tablet  Commonly known as:  AUGMENTIN  Take 1 tablet by mouth 2 (two) times daily.     HYDROcodone-acetaminophen 5-325 MG per tablet  Commonly known as:  NORCO/VICODIN  Take 1-2 tablets by mouth every 4 (four) hours as needed for pain.             Follow-up Information   Follow up with [facility] On 04/13/2012. (Arrive 11:15 for 11:30 appt.)    Contact information:   [address]   [place] [address] [phone]       Signed: Denny Levy [facility] [phone]  03/23/2012, 9:00 AM  Agree with above.  Seen by Dr. Derrell Lolling earlier today.  Ovidio Kin, MD, [facility] Pager: [phone] Office phone:   [phone]

## 2012-03-23 NOTE — ED Provider Notes (Signed)
Medical screening examination/treatment/procedure(s) were performed by resident physician or non-physician practitioner and as supervising physician I was immediately available for consultation/collaboration.   Barkley Bruns MD.   Linna Hoff, MD 03/23/12 1115

## 2012-03-23 NOTE — Progress Notes (Signed)
Patient complaints of wheezing and shortness of breath.  Requesting Albuterol inhaler.  O2 sat 97 on Room air.  MD notified and order received.  Albuterol inhaler at bedside for patient use.  Will continue to monitor.

## 2012-03-23 NOTE — Progress Notes (Signed)
2 Days Post-Op  Subjective: Making progress. Wants to go home. Ambulating in halls. Tolerating regular diet. Voiding uneventfully.afebrile.  She states that she has exercise-induced asthma. No shortness of breath nail. No wheezing. Ask for a prescription for a rescue albuterol inhaler.  Objective: Vital signs in last 24 hours: Temp:  [98.2 F (36.8 C)-98.8 F (37.1 C)] 98.6 F (37 C) (02/11 0541) Pulse Rate:  [70-86] 86 (02/11 0541) Resp:  [16-18] 16 (02/11 0541) BP: (99-121)/(52-74) 121/74 mmHg (02/11 0541) SpO2:  [93 %-96 %] 93 % (02/11 0541) Weight:  [190 lb (86.183 kg)] 190 lb (86.183 kg) (02/10 1500) Last BM Date: 03/22/12  Intake/Output from previous day: 02/10 0701 - 02/11 0700 In: 3042 [P.O.:720; I.V.:2272; IV Piggyback:50] Out: 950 [Urine:950] Intake/Output this shift:    General appearance: alert. Cooperative. Mental status normal. No distress. Resp: lungs clear to auscultation anteriorly. Costal little when she takes a deep breath. No wheezing GI: abdomen soft. Nondistended. Minimal tenderness. Wounds looked good.  Lab Results:  No results found for this or any previous visit (from the past 24 hour(s)).   Studies/Results: @RISRSLT24 @  . antiseptic oral rinse  15 mL Mouth Rinse q12n4p  . chlorhexidine  15 mL Mouth Rinse BID  . piperacillin-tazobactam (ZOSYN)  IV  3.375 g Intravenous Q8H     Assessment/Plan: s/p Procedure(s): APPENDECTOMY LAPAROSCOPIC  Status post laparoscopic appendectomy for perforated appendicitis. Doing well. Meets discharge criteria. Discharge home today with prescription for Augmentin for 5 days and Vicodin. Follow up with Dr. Carolynne Edouard in 2 weeks.  Exercise-induced asthma by history. No wheezing or bronchospasm at this time. She requests prescription for albuterol inhaler.  @PROBHOSP @  LOS: 2 days    Tara Glover M 03/23/2012  . .prob

## 2012-03-23 NOTE — Progress Notes (Signed)
Patient discharged to home.  Discharge instructions completed including follow up care, medications, diet, activity and signs and symptoms of infections.  Verbalizes understanding with no further questions.  Tolerating diet without complaints of nausea.  Ambulating in hall, gait is steady.  No complaints of pain, vital signs stable.  Discharged with friend.

## 2012-04-05 ENCOUNTER — Telehealth: Payer: Self-pay | Admitting: Oncology

## 2012-04-05 ENCOUNTER — Encounter: Payer: Self-pay | Admitting: Oncology

## 2012-04-05 ENCOUNTER — Other Ambulatory Visit: Payer: PRIVATE HEALTH INSURANCE | Admitting: Lab

## 2012-04-05 ENCOUNTER — Ambulatory Visit (HOSPITAL_BASED_OUTPATIENT_CLINIC_OR_DEPARTMENT_OTHER): Payer: PRIVATE HEALTH INSURANCE | Admitting: Oncology

## 2012-04-05 ENCOUNTER — Ambulatory Visit (INDEPENDENT_AMBULATORY_CARE_PROVIDER_SITE_OTHER): Payer: PRIVATE HEALTH INSURANCE | Admitting: General Surgery

## 2012-04-05 ENCOUNTER — Ambulatory Visit (HOSPITAL_BASED_OUTPATIENT_CLINIC_OR_DEPARTMENT_OTHER): Payer: PRIVATE HEALTH INSURANCE

## 2012-04-05 ENCOUNTER — Encounter (INDEPENDENT_AMBULATORY_CARE_PROVIDER_SITE_OTHER): Payer: Self-pay | Admitting: General Surgery

## 2012-04-05 VITALS — BP 119/76 | HR 81 | Temp 98.2°F | Resp 18 | Wt 181.5 lb

## 2012-04-05 VITALS — BP 114/68 | HR 60 | Temp 98.0°F | Resp 14 | Ht 62.0 in | Wt 181.8 lb

## 2012-04-05 DIAGNOSIS — D3A020 Benign carcinoid tumor of the appendix: Secondary | ICD-10-CM | POA: Insufficient documentation

## 2012-04-05 NOTE — Telephone Encounter (Signed)
LVOM for pt to return call in re to NP appt today.

## 2012-04-05 NOTE — Progress Notes (Signed)
Checked in new patient. No financial issues. °

## 2012-04-05 NOTE — Telephone Encounter (Signed)
Pt called to confirmed NP appt today @ 2:30 w/Dr. Arline Asp.  Referring Dr. Carolynne Edouard Dx-Carcinoid Appendical Welcome packet at registration.

## 2012-04-05 NOTE — Progress Notes (Signed)
Subjective:     Patient ID: Tara Glover, female   DOB: Jan 09, 1956, 57 y.o.   MRN: [id number]  HPI The patient is a 57 year old white female who is about 2 weeks status post laparoscopic appendectomy for a perforated appendicitis. 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 Her proximal appendiceal margin was clean. She feels well and has no complaints today. Her appetite is good and her bowels are working normally.  Review of Systems     Objective:   Physical Exam On exam her abdomen is soft and nontender. Her incisions are healing nicely. There is no sign of infection.    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 I will get the opinion of our surgical oncologist as well as the medical oncologist. We will plan to see her back in one month.

## 2012-04-05 NOTE — Telephone Encounter (Signed)
Gave pt appt for MD and lab on May 2014

## 2012-04-05 NOTE — Patient Instructions (Signed)
May return to normal activities 

## 2012-04-05 NOTE — Progress Notes (Signed)
This office note has been dictated.  #[id number]

## 2012-04-06 NOTE — Progress Notes (Signed)
CC:   Ollen Gross. Vernell Morgans, M.D. Delaney Meigs, M.D.  PROBLEM LIST: 1. Appendiceal carcinoid incidental finding when patient presented     with a perforated appendicitis on 03/21/2012.  This was a well     differentiated neuroendocrine tumor (carcinoid), tubular type,     measuring 1.5 cm in greatest dimension.  There was focal visceral     surface involvement as well as lymphovascular invasion.  The     proximal margin was negative.  Tumor stage was pT1b pMX MX.     There was no goblet cell component identified within the tumor. 2. Acute appendicitis associated with perforation, status post     laparoscopic appendectomy on 03/21/2012.  The tumor was not     suspected at the time of surgery. 3. 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  MEDICATIONS:  Reviewed and recorded. Current Outpatient Prescriptions  Medication Sig Dispense Refill  . albuterol (PROVENTIL HFA;VENTOLIN HFA) 108 (90 BASE) MCG/ACT inhaler Inhale 2 puffs into the lungs every 6 (six) hours as needed for wheezing.  1 Inhaler  2   No current facility-administered medications for this visit.    SMOKING HISTORY:  The patient has never smoked cigarettes.    HISTORY:  Tara Glover is a 57 year old white female whom I am asked to see in consultation by Dr. 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  This was well differentiated, tubular type, with no goblet cell component. There was some focal visceral surface involvement as well as lymphovascular invasion.  The patient has been quite well with virtually unremarkable medical history.  She developed abdominal pain that moved to her right lower quadrant on 03/19/2012.  The pain persisted, was not associated with nausea, vomiting, fever, diarrhea.  The pain was not getting any worse, but was not getting any better.  The patient went to an urgent care facility, then referred to the  emergency room where she had a CT scan of the abdomen and pelvis with IV contrast.  She was noted to have an extensive inflammatory process in the right lower quadrant associated with a markedly inflamed appendix.  The appendix was distended to 11 mm and demonstrated diffuse wall thickening.  There was a small amount of extraluminal air on image 60 consistent with perforation.  There was surrounding ill-defined fluid, but no well- defined fluid collection.  There was no evidence for bowel obstruction. There was no evidence for pelvic mass, fluid collection, or inflammatory process.  Gynecologic organs and bladder were normal.  There were hepatic and renal cysts.  The patient underwent laparoscopic appendectomy on 03/21/2012.  Apparently, had a fairly uneventful course and was discharged a couple of days later on antibiotics.  At the present time she is doing quite well.  She is here today regarding recommendations for evaluation, possibly other treatments regarding her appendiceal carcinoid tumor.  PAST MEDICAL HISTORY:  The patient suffered a fracture of her proximal tibia in 2011, required placement of plate in [place].  She had a fracture of her left foot when she was in her 75s.  This apparently required no specific treatment.  She also suffered a concussion at age 39 or 25, was hospitalized for that problem.  She denies any history of blood transfusions, any allergies to any medicines.  She is on no regular medicines, but does have an inhaler which she uses infrequently as needed.  FAMILY HISTORY:  Mother is alive  at age 92, has a pacemaker, diabetes mellitus, and hypertension.  Father died in his late 80s, had coronary artery disease.  The patient has 4 sisters and a brother who are in good health.  She has no children.  Her mother's brother had some unknown cancer.  SOCIAL HISTORY:  Patient denies any use of cigarettes or other tobacco products, alcohol, or drugs.  She was born  in [place], raised in [place], moved to [place] in 1995.  She currently lives in [place].  Works for [employer] which is in [place], a [employer] associated with [employer].  The patient is single, lives in [place].  She has farm animals, specifically goats, chickens, and ducts.  She is generally active.  REVIEW OF SYSTEMS:  The patient feels fine at the present time, denies any pain.  She denies any neurologic problems.  She wears glasses.  No visual problems or eye diseases.  Hearing is good.  No sinus problems or hay fever.  The patient has never had a colonoscopy.  She denies any GI symptoms.  No history of liver disease, jaundice, hepatitis.  No cardiac symptoms.  She does wheeze with exertion and has had that for 30 years. She had pneumonia in the 7th grade.  She has had some post-surgical wheezing.  The patient denies any breast problems.  Her last mammogram was 8 years ago.  She was strongly urged to have a mammogram and colonoscopy.  No urinary symptoms.  No swelling of the legs, blood clots, intermittent claudication.  The patient underwent menopause about 7 years ago, did not have hot flashes.  No bleeding or bruising problems.  She has some arthritic discomfort in her left shoulder.  No back or neck problems.  No fever, night sweats, chills.  No pruritus or skin rashes.  No history of depression.  Currently, the patient has not returned to work yet.  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  Tumor stage which is based on tumor size is pT1b.  No lymph nodes were removed.  Surgery was carried out on 03/21/2012 consisting of laparoscopic appendectomy.  The appendix was perforated.  According to NCCN guidelines, this patient requires no further therapy, specifically does not require a right hemicolectomy nor does she require any specific  followup or monitoring.  I would be inclined as a matter for establishing a baseline to probably repeat the patient's CT scan in the abdomen in 6 months.  Thereafter, if the patient is doing well, I am reluctant to order any routine scanning. Patient's prognosis on the basis of her tumor characteristics should be truly excellent with a very low risk for development of recurrent or metastatic disease.  I have recommended the patient have screening mammograms.  She will need to have a colonoscopy probably in a few months when she is fully recovered and healed from her surgery.  I have asked Latasha to return in 3 months, at which time we will check CBC and chemistries.  I am not inclined to obtain a 24-hour urine for 5- HIAA or check a chromogranin level unless the patient has symptoms.  It should be noted that the patient did not have labs today; however, she did have labs carried out at the time of her admission on 03/21/2012. At that time her white count was 21.0 with an ANC of 18.7, hemoglobin 14.1, hematocrit 40.4, and platelets 271,000.  Chemistries were notable for a glucose of 140, an albumin of 3.8, lipase 17, BUN 18, creatinine 0.92.    ______________________________ Samul Dada, M.D. DSM/MEDQ  D:  04/05/2012  T:  04/06/2012  Job:  [id number]

## 2012-04-13 ENCOUNTER — Encounter (INDEPENDENT_AMBULATORY_CARE_PROVIDER_SITE_OTHER): Payer: PRIVATE HEALTH INSURANCE

## 2012-04-27 ENCOUNTER — Ambulatory Visit (INDEPENDENT_AMBULATORY_CARE_PROVIDER_SITE_OTHER): Payer: PRIVATE HEALTH INSURANCE | Admitting: General Surgery

## 2012-04-27 ENCOUNTER — Encounter (INDEPENDENT_AMBULATORY_CARE_PROVIDER_SITE_OTHER): Payer: Self-pay

## 2012-04-27 VITALS — BP 118/74 | HR 60 | Temp 97.6°F | Resp 18 | Ht 62.0 in | Wt 184.8 lb

## 2012-04-27 NOTE — Patient Instructions (Signed)
She may resume a regular diet and full activity.  She may follow-up on a PRN basis.  Follow up with your oncologist and PCP as directed.  You may have your colonoscopy anytime since you are greater than 4 weeks post-op.

## 2012-04-27 NOTE — Progress Notes (Signed)
  Subjective: Tara Glover is a 57 y.o. female who had a laparoscopic appendectomy with on 03/21/12 by Dr. Carolynne Edouard.  The pathology report confirmed appendiceal carcinoid.  The patient is tolerating their diet well and is having no severe pain.  Bowel function is good.  No problems with the wounds.  Pt is returning to normal activity / work.   Objective: Vital signs in last 24 hours: Reviewed   PE: General:  Alert, NAD, pleasant Abd: soft, NT/ND, +bs, incisions appear well-healed with no sign of infection or bleeding.     Assessment/Plan  1.  S/P Laparoscopic Appendectomy for Appendiceal Carcinoid: doing well, may resume regular activity without restrictions, Pt will follow up with [place] PRN and knows to call with questions or concerns.   2.  Currently being treated by Dr. Arline Asp, Oncology who wants her to have a CSP and mammogram.  She will follow up with him in 3 months for follow up.  Talked to Dr. Jamey Ripa who said she could have her CSP anytime now that she is >102mo out from surgery.    Aris Georgia, PA-C 04/27/2012

## 2012-04-29 ENCOUNTER — Telehealth (INDEPENDENT_AMBULATORY_CARE_PROVIDER_SITE_OTHER): Payer: Self-pay

## 2012-04-29 NOTE — Telephone Encounter (Signed)
I tried calling patient back but she did not answer and no VM picked up. Dr Carolynne Edouard is not available to answer her questions this week. I can have him call her next week or if she wants to let me know what her questions are, I can ask him and call her back next week.

## 2012-04-29 NOTE — Telephone Encounter (Signed)
Message copied by Brennan Bailey on Thu Apr 29, 2012  3:15 PM ------      Message from: Marnette Burgess      Created: Wed Apr 28, 2012  9:32 AM      Contact: [phone]       Patient had an appy by Dr. PT on 03/21/12 and was seen by Baron Hamper, [facility], on 04/27/12, and she was reading her path report and has a question, please call. ------

## 2012-05-04 ENCOUNTER — Encounter (INDEPENDENT_AMBULATORY_CARE_PROVIDER_SITE_OTHER): Payer: Self-pay | Admitting: General Surgery

## 2012-05-06 ENCOUNTER — Encounter (INDEPENDENT_AMBULATORY_CARE_PROVIDER_SITE_OTHER): Payer: Self-pay | Admitting: *Deleted

## 2012-05-12 ENCOUNTER — Encounter (INDEPENDENT_AMBULATORY_CARE_PROVIDER_SITE_OTHER): Payer: Self-pay | Admitting: Internal Medicine

## 2012-05-12 ENCOUNTER — Ambulatory Visit (INDEPENDENT_AMBULATORY_CARE_PROVIDER_SITE_OTHER): Payer: PRIVATE HEALTH INSURANCE | Admitting: Internal Medicine

## 2012-05-12 ENCOUNTER — Telehealth (INDEPENDENT_AMBULATORY_CARE_PROVIDER_SITE_OTHER): Payer: Self-pay | Admitting: *Deleted

## 2012-05-12 ENCOUNTER — Other Ambulatory Visit (INDEPENDENT_AMBULATORY_CARE_PROVIDER_SITE_OTHER): Payer: Self-pay | Admitting: *Deleted

## 2012-05-12 VITALS — BP 100/62 | HR 68 | Ht 62.0 in | Wt 186.2 lb

## 2012-05-12 DIAGNOSIS — Z1211 Encounter for screening for malignant neoplasm of colon: Secondary | ICD-10-CM

## 2012-05-12 DIAGNOSIS — C7A00 Malignant carcinoid tumor of unspecified site: Secondary | ICD-10-CM

## 2012-05-12 NOTE — Progress Notes (Signed)
Subjective:     Patient ID: Tara Glover, female   DOB: March 03, 1955, 57 y.o.   MRN: [id number]  HPI Referred to our office by Marin Olp NP with [facility] for a screening colonoscopy. She has never undergone a colonoscopy in the past. Appetite is good.  No acid reflux. No weight loss. No abdominal pain.  Recently underwent an appendectomy in February for a ruptured appendix. (see below). BMs are normal. She take 2 Advil a day. She underwent an appendectomy on 03/31/2012 for a ruptured appendix by Chevis Pretty. 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 Focal appendiceal visceral surface involvement is present. Lymphovascular is identified. Associated acute full thickness appendicitis. Proximal margin is negative. CBC    Component Value Date/Time   WBC 21.0* 03/21/2012 1247   RBC 4.72 03/21/2012 1247   HGB 14.1 03/21/2012 1247   HCT 40.4 03/21/2012 1247   PLT 271 03/21/2012 1247   MCV 85.6 03/21/2012 1247   MCH 29.9 03/21/2012 1247   MCHC 34.9 03/21/2012 1247   RDW 13.2 03/21/2012 1247   LYMPHSABS 1.2 03/21/2012 1247   MONOABS 1.1* 03/21/2012 1247   EOSABS 0.0 03/21/2012 1247   BASOSABS 0.0 03/21/2012 1247    CMP     Component Value Date/Time   NA 137 03/21/2012 1247   K 3.9 03/21/2012 1247   CL 99 03/21/2012 1247   CO2 25 03/21/2012 1247   GLUCOSE 140* 03/21/2012 1247   BUN 18 03/21/2012 1247   CREATININE 0.92 03/21/2012 1247   CALCIUM 9.5 03/21/2012 1247   PROT 8.0 03/21/2012 1247   ALBUMIN 3.8 03/21/2012 1247   AST 13 03/21/2012 1247   ALT 12 03/21/2012 1247   ALKPHOS 78 03/21/2012 1247   BILITOT 0.7 03/21/2012 1247   GFRNONAA 68* 03/21/2012 1247   GFRAA 79* 03/21/2012 1247    Review of Systems  See hpi Current Outpatient Prescriptions  Medication Sig Dispense Refill  . ibuprofen (ADVIL,MOTRIN) 100 MG chewable tablet Chew 100 mg by mouth every 8 (eight) hours as needed for fever.      Marland Kitchen albuterol (PROVENTIL HFA;VENTOLIN HFA) 108 (90 BASE) MCG/ACT inhaler Inhale 2 puffs  into the lungs every 6 (six) hours as needed for wheezing.  1 Inhaler  2   No current facility-administered medications for this visit.   Past Medical History  Diagnosis Date  . Asthma     exercise induced   Past Surgical History  Procedure Laterality Date  . Knee surgery    . Laparoscopic appendectomy N/A 03/21/2012    Procedure: APPENDECTOMY LAPAROSCOPIC;  Surgeon: Robyne Askew, MD;  Location: [facility] OR;  Service: General;  Laterality: N/A;   No Known Allergies       Objective:   Physical Exam  Filed Vitals:   05/12/12 1452  BP: 100/62  Pulse: 68  Height: 5\' 2"  (1.575 m)  Weight: 186 lb 3.2 oz (84.46 kg)  Alert and oriented. Skin warm and dry. Oral mucosa is moist.   . Sclera anicteric, conjunctivae is pink. Thyroid not enlarged. No cervical lymphadenopathy. Lungs clear. Heart regular rate and rhythm.  Abdomen is soft. Bowel sounds are positive. No hepatomegaly. No abdominal masses felt. No tenderness.  No edema to lower extremities.        Assessment:    Screening colonoscopy. Recent hx of carcinoid appendix.     Plan:    Screening colonoscopy.The risks and benefits such as perforation, bleeding, and infection were reviewed with the patient  and is agreeable.

## 2012-05-12 NOTE — Telephone Encounter (Signed)
Patient needs movi prep 

## 2012-05-12 NOTE — Patient Instructions (Signed)
Screening colonoscopy.The risks and benefits such as perforation, bleeding, and infection were reviewed with the patient and is agreeable. 

## 2012-05-13 MED ORDER — PEG-KCL-NACL-NASULF-NA ASC-C 100 G PO SOLR: 1.0000 | Freq: Once | ORAL | Status: DC

## 2012-05-21 ENCOUNTER — Encounter (HOSPITAL_COMMUNITY): Payer: Self-pay | Admitting: Pharmacy Technician

## 2012-06-02 ENCOUNTER — Ambulatory Visit (HOSPITAL_COMMUNITY)
Admission: RE | Admit: 2012-06-02 | Discharge: 2012-06-02 | Disposition: A | Discharge status: Home / Self Care | Payer: PRIVATE HEALTH INSURANCE | Source: Ambulatory Visit | Attending: Internal Medicine | Admitting: Internal Medicine

## 2012-06-02 ENCOUNTER — Encounter (HOSPITAL_COMMUNITY)
Admission: RE | Disposition: A | Discharge status: Home / Self Care | Payer: Self-pay | Source: Ambulatory Visit | Attending: Internal Medicine

## 2012-06-02 ENCOUNTER — Encounter (HOSPITAL_COMMUNITY): Payer: Self-pay | Admitting: *Deleted

## 2012-06-02 DIAGNOSIS — K6389 Other specified diseases of intestine: Secondary | ICD-10-CM

## 2012-06-02 DIAGNOSIS — Z1211 Encounter for screening for malignant neoplasm of colon: Secondary | ICD-10-CM

## 2012-06-02 DIAGNOSIS — D3A020 Benign carcinoid tumor of the appendix: Secondary | ICD-10-CM | POA: Insufficient documentation

## 2012-06-02 DIAGNOSIS — D126 Benign neoplasm of colon, unspecified: Secondary | ICD-10-CM

## 2012-06-02 DIAGNOSIS — K644 Residual hemorrhoidal skin tags: Secondary | ICD-10-CM | POA: Insufficient documentation

## 2012-06-02 DIAGNOSIS — J45909 Unspecified asthma, uncomplicated: Secondary | ICD-10-CM | POA: Insufficient documentation

## 2012-06-02 DIAGNOSIS — Z79899 Other long term (current) drug therapy: Secondary | ICD-10-CM | POA: Insufficient documentation

## 2012-06-02 HISTORY — PX: COLONOSCOPY: SHX5424

## 2012-06-02 SURGERY — COLONOSCOPY
Anesthesia: Moderate Sedation

## 2012-06-02 MED ORDER — MIDAZOLAM HCL 5 MG/5ML IJ SOLN
INTRAMUSCULAR | Status: AC
  Filled 2012-06-02: qty 10

## 2012-06-02 MED ORDER — MEPERIDINE HCL 50 MG/ML IJ SOLN
INTRAMUSCULAR | Status: DC | PRN
  Administered 2012-06-02 (×2): 25 mg via INTRAVENOUS

## 2012-06-02 MED ORDER — MIDAZOLAM HCL 5 MG/5ML IJ SOLN
INTRAMUSCULAR | Status: DC | PRN
  Administered 2012-06-02: 1 mg via INTRAVENOUS
  Administered 2012-06-02 (×2): 2 mg via INTRAVENOUS

## 2012-06-02 MED ORDER — MEPERIDINE HCL 50 MG/ML IJ SOLN
INTRAMUSCULAR | Status: AC
  Filled 2012-06-02: qty 1

## 2012-06-02 MED ORDER — SODIUM CHLORIDE 0.9 % IV SOLN
INTRAVENOUS | Status: DC
  Administered 2012-06-02: 12:00:00 via INTRAVENOUS

## 2012-06-02 NOTE — Op Note (Signed)
COLONOSCOPY PROCEDURE REPORT  PATIENT:  Tara Glover  MR#:  [id number] Birthdate:  12-24-1955, 57 y.o., female Endoscopist:  Dr. Malissa Hippo, MD Referred By:  Dr. Josue Hector, MD  Procedure Date: 06/02/2012  Procedure:   Colonoscopy  Indications:  Patient is 57 year old Caucasian female was undergoing average risk screening colonoscopy. She underwent laparoscopic appendectomy in February this year and found to have small appendiceal carcinoid.  Informed Consent:  The procedure and risks were reviewed with the patient and informed consent was obtained.  Medications:  Demerol 50 mg IV Versed 4 mg IV  Description of procedure:  After a digital rectal exam was performed, that colonoscope was advanced from the anus through the rectum and colon to the area of the cecum, ileocecal valve and appendiceal orifice. The cecum was deeply intubated. These structures were well-seen and photographed for the record. From the level of the cecum and ileocecal valve, the scope was slowly and cautiously withdrawn. The mucosal surfaces were carefully surveyed utilizing scope tip to flexion to facilitate fold flattening as needed. The scope was pulled down into the rectum where a thorough exam including retroflexion was performed. Terminal ileum was also examined.  Findings:   Prep excellent. Mucosa of terminal ileum. No abnormality noted to cecal mucosa or extrinsic impression. 4 mm polyp cold snared from rectosigmoid junction. Small hemorrhoids below the dentate line in small anal papillae.    Therapeutic/Diagnostic Maneuvers Performed:  See above  Complications:  None  Cecal Withdrawal Time:  12 minutes  Impression:  Normal mucosa of terminal ileum. 4 mm polyp cold snared from rectosigmoid junction. Small external hemorrhoids and anal papillae.  Recommendations:  Standard instructions given. I will contact patient with biopsy results and further recommendations.  Percival Glasheen U   06/02/2012 1:14 PM  CC: Dr. Josue Hector, MD & Dr. Bonnetta Barry ref. provider found

## 2012-06-02 NOTE — H&P (Signed)
Tara Glover is an 57 y.o. female.   Chief Complaint: Patient is here for colonoscopy. HPI: Patient is 57 year old Caucasian female who is in for screening colonoscopy. She denies abdominal pain change in her bowel habits or rectal bleeding. In February of this year she had laparoscopic appendectomy and on for 15 mm carcinoid. She was felt to have localized disease. She is due for abdominal pelvic CT in August 2014. Family history is negative for colorectal carcinoma.  Past Medical History  Diagnosis Date  . Asthma     exercise induced    Past Surgical History  Procedure Laterality Date  . Knee surgery    . Laparoscopic appendectomy N/A 03/21/2012    Procedure: APPENDECTOMY LAPAROSCOPIC;  Surgeon: Robyne Askew, MD;  Location: [facility] OR;  Service: General;  Laterality: N/A;    Family History  Problem Relation Age of Onset  . Heart disease Mother   . Heart disease Father   . Colon cancer Neg Hx    Social History:  reports that she has never smoked. She does not have any smokeless tobacco history on file. She reports that she does not drink alcohol or use illicit drugs.  Allergies: No Known Allergies  Medications Prior to Admission  Medication Sig Dispense Refill  . ibuprofen (ADVIL,MOTRIN) 200 MG tablet Take 200 mg by mouth 2 (two) times daily as needed for pain.      . peg 3350 powder (MOVIPREP) 100 G SOLR Take 1 kit (100 g total) by mouth once.  1 kit  0  . albuterol (PROVENTIL HFA;VENTOLIN HFA) 108 (90 BASE) MCG/ACT inhaler Inhale 2 puffs into the lungs every 6 (six) hours as needed for wheezing.  1 Inhaler  2    No results found for this or any previous visit (from the past 48 hour(s)). No results found.  ROS  Blood pressure 136/82, pulse 79, temperature 97.5 F (36.4 C), temperature source Oral, resp. rate 24, height 5\' 2"  (1.575 m), weight 186 lb (84.369 kg), SpO2 96.00%. Physical Exam  Constitutional: She appears well-developed and well-nourished.  HENT:  Mouth/Throat:  Oropharynx is clear and moist.  Eyes: Conjunctivae are normal. No scleral icterus.  Neck: No thyromegaly present.  Cardiovascular: Normal rate, regular rhythm and normal heart sounds.   No murmur heard. Respiratory: Effort normal and breath sounds normal.  GI: Soft. She exhibits no distension and no mass. There is no tenderness.  Musculoskeletal: She exhibits no edema.  Lymphadenopathy:    She has no cervical adenopathy.  Neurological: She is alert.  Skin: Skin is warm and dry.     Assessment/Plan Average risk screening colonoscopy.  Zackari Ruane U 06/02/2012, 12:29 PM

## 2012-06-04 ENCOUNTER — Encounter (HOSPITAL_COMMUNITY): Payer: Self-pay | Admitting: Internal Medicine

## 2012-06-14 ENCOUNTER — Encounter (INDEPENDENT_AMBULATORY_CARE_PROVIDER_SITE_OTHER): Payer: Self-pay | Admitting: *Deleted

## 2012-07-02 ENCOUNTER — Encounter: Payer: Self-pay | Admitting: Oncology

## 2012-07-02 ENCOUNTER — Other Ambulatory Visit (HOSPITAL_BASED_OUTPATIENT_CLINIC_OR_DEPARTMENT_OTHER): Payer: PRIVATE HEALTH INSURANCE | Admitting: Lab

## 2012-07-02 ENCOUNTER — Ambulatory Visit (HOSPITAL_BASED_OUTPATIENT_CLINIC_OR_DEPARTMENT_OTHER): Payer: PRIVATE HEALTH INSURANCE | Admitting: Oncology

## 2012-07-02 ENCOUNTER — Telehealth: Payer: Self-pay | Admitting: Oncology

## 2012-07-02 VITALS — BP 118/73 | HR 66 | Temp 98.0°F | Resp 18 | Ht 62.0 in | Wt 186.9 lb

## 2012-07-02 DIAGNOSIS — D3A020 Benign carcinoid tumor of the appendix: Secondary | ICD-10-CM

## 2012-07-02 DIAGNOSIS — C181 Malignant neoplasm of appendix: Secondary | ICD-10-CM

## 2012-07-02 LAB — COMPREHENSIVE METABOLIC PANEL (CC13)
AST: 21 U/L (ref 5–34)
Albumin: 4.2 g/dL (ref 3.5–5.0)
Alkaline Phosphatase: 86 U/L (ref 40–150)
Glucose: 105 mg/dl — ABNORMAL HIGH (ref 70–99)
Potassium: 4 mEq/L (ref 3.5–5.1)
Sodium: 141 mEq/L (ref 136–145)
Total Protein: 7.5 g/dL (ref 6.4–8.3)

## 2012-07-02 LAB — CBC WITH DIFFERENTIAL/PLATELET
BASO%: 0.6 % (ref 0.0–2.0)
Basophils Absolute: 0 10*3/uL (ref 0.0–0.1)
EOS%: 2.5 % (ref 0.0–7.0)
HGB: 13.2 g/dL (ref 11.6–15.9)
MCH: 28.5 pg (ref 25.1–34.0)
NEUT#: 3.6 10*3/uL (ref 1.5–6.5)
RDW: 13.6 % (ref 11.2–14.5)
WBC: 6.4 10*3/uL (ref 3.9–10.3)
lymph#: 2.1 10*3/uL (ref 0.9–3.3)

## 2012-07-02 NOTE — Progress Notes (Signed)
This office note has been dictated.  #[id number]

## 2012-07-03 NOTE — Progress Notes (Signed)
CC:   Tara Glover. Tara Glover, M.D. Tara Glover, M.D.  PROBLEM LIST:  1. Appendiceal carcinoid incidental finding when patient presented  with a perforated appendicitis on 03/21/2012. This was a well  differentiated neuroendocrine tumor (carcinoid), tubular type,  measuring 1.5 cm in greatest dimension. There was focal visceral  surface involvement as well as lymphovascular invasion. The  proximal margin was negative. Tumor stage was pT1b pMX MX.  There was no goblet cell component identified within the tumor.  2. Acute appendicitis associated with perforation, status post  laparoscopic appendectomy on 03/21/2012. The tumor was not  suspected at the time of surgery.  3. 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   MEDICATIONS:  Reviewed and recorded. No current outpatient prescriptions on file.   No current facility-administered medications for this visit.    SMOKING HISTORY:  The patient has never smoked cigarettes.   HISTORY:  I saw Tara Glover today for followup of her incidentally detected appendiceal carcinoid measuring 1.5 cm in maximum dimension. This was a well differentiated tubular type with no goblet cell component.  There was some focal visceral surface involvement as well as lymphovascular invasion.  The patient was seen here for initial consultation on 04/05/2012.  It was determined that she did not require any further therapeutic intervention.  Specifically she did not require a right hemicolectomy.  It was also suggested that she did not require any specific monitoring or followup.  I did recommend that she have a colonoscopy.  This was carried out by Dr. Lionel Glover on 06/02/2012. The patient was 58 and had never had a colonoscopy.  A 3 mm tubular adenoma was found in the rectosigmoid area and removed.  The rest of the colonoscopy was essentially unremarkable.  The patient is without complaints today, feels well.  PHYSICAL EXAMINATION:   She looks well.  There are no major changes. Weight is 186 pounds 14.4 ounces.  Height 5 feet 2 inches.  Body surface area 1.93 sq m.  Blood pressure today 118/73.  Other vital signs are normal.  Her physical exam is basically unremarkable and unchanged from that recorded on 04/05/2012.  Surgical incisions have healed.  There is no scleral icterus.  Mouth and pharynx are benign.  No peripheral adenopathy palpable.  Breasts are not examined.  Heart and lungs are normal.  Abdomen is somewhat obese, nontender with no organomegaly or masses palpable.  Her surgical incisions have healed well.  Extremities: No peripheral edema.  Neurologic exam was grossly normal.  LABORATORY DATA:  Today, white count 6.4, ANC 3.6, hemoglobin 13.2, hematocrit 39.7, platelets 262,000.  Chemistries today were normal. Glucose was 105, BUN 20, creatinine 0.9, albumin 4.2, LDH 208.  IMAGING STUDIES:   1. CT scan of abdomen and pelvis with IV contrast on 03/21/2012 showed a perforated acute appendicitis with no drainable abscess.  There was a nonobstructing calculi in the upper pole of the left kidney.  Hepatic and renal cysts were present.   IMPRESSION AND PLAN:  As stated above, the patient does not require any special therapy or followup.  I am, however, recommending a baseline CT scan of abdomen and pelvis with IV contrast to be carried out in late August.  This will serve more as a baseline for comparison following the initial CT scan on 03/21/2012.  The patient will return about 1 week following her CT scan, somewhere around August 29, at which time she will have CBC and chemistries.  Thereafter she may  not require any special follow-up through the [facility] or perhaps be seen for a while on a yearly basis.    ______________________________ Samul Dada, M.D. DSM/MEDQ  D:  07/02/2012  T:  07/03/2012  Job:  [id number]

## 2012-07-20 ENCOUNTER — Telehealth (INDEPENDENT_AMBULATORY_CARE_PROVIDER_SITE_OTHER): Payer: Self-pay | Admitting: *Deleted

## 2012-07-20 NOTE — Telephone Encounter (Signed)
Tara Glover said her EOB states [name], NP was out of network. Would like to see if she could get the DOS: 05/12/12 refiled under Dr. Karilyn Cota. The Claim number is [id number]. If you have any further question her return phone number is [phone] or [phone].

## 2012-07-22 NOTE — Telephone Encounter (Signed)
I will send this to Tara Glover and get this corrected for her.

## 2012-09-26 ENCOUNTER — Encounter (INDEPENDENT_AMBULATORY_CARE_PROVIDER_SITE_OTHER): Payer: Self-pay | Admitting: General Surgery

## 2012-10-01 ENCOUNTER — Ambulatory Visit (HOSPITAL_COMMUNITY)
Admission: RE | Admit: 2012-10-01 | Discharge: 2012-10-01 | Disposition: A | Discharge status: Home / Self Care | Payer: PRIVATE HEALTH INSURANCE | Source: Ambulatory Visit | Attending: Oncology | Admitting: Oncology

## 2012-10-01 ENCOUNTER — Telehealth: Payer: Self-pay | Admitting: Hematology and Oncology

## 2012-10-01 DIAGNOSIS — Z09 Encounter for follow-up examination after completed treatment for conditions other than malignant neoplasm: Secondary | ICD-10-CM | POA: Insufficient documentation

## 2012-10-01 DIAGNOSIS — Z9089 Acquired absence of other organs: Secondary | ICD-10-CM | POA: Insufficient documentation

## 2012-10-01 DIAGNOSIS — Z859 Personal history of malignant neoplasm, unspecified: Secondary | ICD-10-CM | POA: Insufficient documentation

## 2012-10-01 DIAGNOSIS — N281 Cyst of kidney, acquired: Secondary | ICD-10-CM | POA: Insufficient documentation

## 2012-10-01 DIAGNOSIS — K7689 Other specified diseases of liver: Secondary | ICD-10-CM | POA: Insufficient documentation

## 2012-10-01 DIAGNOSIS — D3A020 Benign carcinoid tumor of the appendix: Secondary | ICD-10-CM

## 2012-10-01 MED ORDER — IOHEXOL 300 MG/ML  SOLN
100.0000 mL | Freq: Once | INTRAMUSCULAR | Status: AC | PRN
  Administered 2012-10-01: 100 mL via INTRAVENOUS

## 2012-10-01 NOTE — Telephone Encounter (Signed)
Moved 8/29 appts to 9/4 due to CP out. lmonvm for pt re change w/new d/t for lb/CP1 9/4 @ 3:30pm. Schedule mailed.

## 2012-10-08 ENCOUNTER — Other Ambulatory Visit: Payer: PRIVATE HEALTH INSURANCE | Admitting: Lab

## 2012-10-08 ENCOUNTER — Ambulatory Visit: Payer: PRIVATE HEALTH INSURANCE

## 2012-10-12 ENCOUNTER — Telehealth: Payer: Self-pay | Admitting: Internal Medicine

## 2012-10-12 NOTE — Telephone Encounter (Signed)
Returned pt's call re 9/4 appt. Pt asked to call back as I am unsure why she is calling except in regards to the 9/4 appt.

## 2012-10-14 ENCOUNTER — Encounter: Payer: Self-pay | Admitting: Internal Medicine

## 2012-10-14 ENCOUNTER — Ambulatory Visit (HOSPITAL_BASED_OUTPATIENT_CLINIC_OR_DEPARTMENT_OTHER): Payer: PRIVATE HEALTH INSURANCE | Admitting: Internal Medicine

## 2012-10-14 ENCOUNTER — Telehealth: Payer: Self-pay | Admitting: *Deleted

## 2012-10-14 ENCOUNTER — Other Ambulatory Visit (HOSPITAL_BASED_OUTPATIENT_CLINIC_OR_DEPARTMENT_OTHER): Payer: PRIVATE HEALTH INSURANCE | Admitting: Lab

## 2012-10-14 VITALS — BP 125/55 | HR 65 | Temp 98.4°F | Resp 19 | Ht 62.0 in | Wt 186.9 lb

## 2012-10-14 DIAGNOSIS — C7A020 Malignant carcinoid tumor of the appendix: Secondary | ICD-10-CM

## 2012-10-14 DIAGNOSIS — D3A020 Benign carcinoid tumor of the appendix: Secondary | ICD-10-CM

## 2012-10-14 DIAGNOSIS — Q519 Congenital malformation of uterus and cervix, unspecified: Secondary | ICD-10-CM

## 2012-10-14 LAB — CBC WITH DIFFERENTIAL/PLATELET
EOS%: 2.3 % (ref 0.0–7.0)
MCH: 28.9 pg (ref 25.1–34.0)
MCHC: 33.5 g/dL (ref 31.5–36.0)
MCV: 86.1 fL (ref 79.5–101.0)
MONO%: 7.2 % (ref 0.0–14.0)
RBC: 4.54 10*6/uL (ref 3.70–5.45)
RDW: 13.3 % (ref 11.2–14.5)

## 2012-10-14 LAB — COMPREHENSIVE METABOLIC PANEL (CC13)
ALT: 20 U/L (ref 0–55)
Alkaline Phosphatase: 81 U/L (ref 40–150)
Sodium: 142 mEq/L (ref 136–145)
Total Bilirubin: 0.57 mg/dL (ref 0.20–1.20)
Total Protein: 7.7 g/dL (ref 6.4–8.3)

## 2012-10-14 LAB — LACTATE DEHYDROGENASE (CC13): LDH: 195 U/L (ref 125–245)

## 2012-10-14 NOTE — Telephone Encounter (Signed)
Pt ids aware to continue with her primary physician....td

## 2012-10-14 NOTE — Patient Instructions (Addendum)
Carcinoid Syndrome Carcinoid syndrome is a group of symptoms (the main one is flushing) caused by tumors called carcinoids. Carcinoids secrete hormones (serotonin) and chemicals that cause the symptoms. This happens after they have spread (metastasized). The tumors can occur anywhere. But about 70% of them start out in the appendix or small bowel. Carcinoid syndrome usually affects adults ages 26 to 70, and affects both sexes equally. Carcinoids grow slowly and are often not cancerous (benign). But they can spread and are then cancerous (malignant). In a few cases, carcinoid cells spread to other body parts. There they produce secondary, hormone-producing (serotonin) tumors.  CAUSES  There are no preventive measures which can be taken to prevent this illness. But smoking, increasing age, and a family history for this type of tumor seem to be connected with increased occurrence. Symptoms are caused by secretions of the tumor. Some of the triggers which cause the tumor to release the hormone and bring on symptoms are:  Heavy exercise.  Tomatoes.  Pineapple.  Alcohol.  Plums.  Walnuts.  Bananas.  Avocados.  Cheese. SYMPTOMS  Many people have no symptoms. The main symptom is flushing (like a hot flash) of the head and neck. Others may have symptoms that include:  Flushed skin on the head and neck.  Diarrhea with abdominal cramps.  Irregular heartbeat.  Low blood pressure.  Unexplained weight loss.  Watery eyes.  Respiratory symptoms similar to asthma.  Nausea and vomiting.  Sexual dysfunction in men.  Disease of the heart valves. DIAGNOSIS  Your caregiver will do a physical exam and ask questions about your symptoms. A number of medical tests will be done.   Nonfunctioning carcinoids. This is a carcinoid which is not producing hormones or symptoms. These can be detected similarly to other growths by angiography, CT, or MRI (all specialized x-rays), depending on the  site. Small-bowel carcinoids may show abnormalities on barium x-ray studies. A final diagnosis is made by taking a part of the growth out and looking at it under a microscope.  Functioning carcinoids. These produce hormones and problems. They are suspected on the basis of the symptoms. The diagnosis is confirmed by a urine check which shows increased amounts of the serotonin breakdown product, 5-hydroxyindoleacetic acid (5-HIAA). A test is performed after the patient has stayed away from serotonin-containing foods (for example, bananas, tomatoes, plums, avocados, pineapples, eggplant, and walnuts) for 3 days to avoid false-positive results. On the 3rd day, a 24-h urine sample is collected for testing. Normal excretion of 5-HIAA is < 10 mg/day (< 52 mol/day). In patients with carcinoid syndrome it is usually > 50 mg/day (> 260 mol/ day).  Tests with calcium gluconate, catecholamines, pentagastrin, or alcohol have been used to cause flushing. These may help in the diagnosis in some patient    s but must be performed with care.  Localization of the tumor may require an extensive evaluation, including laparotomy. A liver scan may help show spreading (metastases).  The rare tumors mentioned above must be excluded by the correct examinations. The tests first help diagnose the cancer and then determine if it has spread (staging).  TREATMENT  Treatment varies and depends on:  The location and size of the tumor.  Any spread of the cancer.  Your health, age, and preferences. Treatment may include:  Surgery.  Anticancer medications (chemotherapy).  Other medications. Treatments listed below may be used to control symptoms:  Antidiarrheal medications.  Medications to prevent serotonin production.  Cortisone medications to reduce  inflammation.  Medications to prevent flushed skin.  Anticancer medications (they do not cure these tumors, but may help symptoms).  Avoid foods that trigger  symptoms.  Include at least 2 servings of protein a day.  Multivitamins and niacin supplements.  Do not drink alcohol.  Resume your normal activities once symptoms improve. Avoid strenuous exercise. Surgery is curative if the entire tumor can be removed. Sometimes it can spread to local lymph nodes (your glands), which is cured with surgical removal. If surgery is not completely successful, the symptoms can often be helped with the medications listed above. Chemotherapy or medical treatment of the tumor is usually unsuccessful.  RELATED COMPLICATIONS  High blood pressure.  Bowel obstruction.  Disease of the heart valves.  Renal failure.  Risk for stroke, blood clots, and similar disorders.  Heart failure.  Hives (angioedema). If the carcinoid tumor can be removed completely, there can be a cure. Even removing a large part of a tumor that has spread, helps cut down on symptoms. This is because there is less tumor size to produce the hormone causing the symptoms. FOR MORE INFORMATION National Cancer Institute: www.cancer.gov Document Released: 09/11/2003 Document Revised: 04/21/2011 Document Reviewed: 01/21/2008 [facility] Patient Information 2014 [employer], [place].   1. RTC prn 2. Provided handout w symptoms to watch out for as noted above.  Should you develop any of these, we will schedule an appointment.  Given appendiceal carcinoid less than 2 cm with low risk for recurrence, active surveillance is not indicated at present.

## 2012-10-14 NOTE — Progress Notes (Signed)
Hematology/Oncology Follow-up   CC:   Tara Glover. Vernell Morgans, M.D. Delaney Meigs, M.D.  PROBLEM LIST:  1. 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 This was a well differentiated neuroendocrine tumor (carcinoid), tubular type, measuring 1.5 cm in greatest dimension. There was focal visceral surface involvement as well as lymphovascular invasion. The proximal margin was negative. Tumor stage was pT1b pMX MX. There was no goblet cell component identified within the tumor.  2. Acute appendicitis associated with perforation, status post laparoscopic appendectomy on 03/21/2012. The tumor was not suspected at the time of surgery. 3. 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  MEDICATIONS:  Reviewed and recorded. No current outpatient prescriptions on file.   No current facility-administered medications for this visit.   SMOKING HISTORY:  The patient has never smoked cigarettes.  HISTORY: Maylie Ashton was last seen by Dr. Arline Asp in 07/04/2012 for followup of her incidental detected appendiceal carcinoid measuring 1.5 cm in maximum dimension. This was a well differentiated tubular type with no goblet cell component.  There was some focal visceral surface involvement as well as lymphovascular invasion.  The patient was seen here for initial consultation on 04/05/2012.  It was determined that she did not require any further therapeutic intervention.  Specifically she did not require a right hemicolectomy.  It was also suggested that she did not require any specific monitoring or followup.  I did recommend that she have a colonoscopy (Dr. Dr. Lionel December on 06/02/2012).  A 3 mm tubular adenoma was found in the rectosigmoid area and removed.  The rest of the colonoscopy was essentially unremarkable.  Today, she is without complaints.  She denies flushing, diarrhea, nausea and vomiting, unexplained weight loss.    PMHX,  Social History and Family has been updated and reviewed.   PHYSICAL EXAMINATION:  BP 125/55  Pulse 65  Temp(Src) 98.4 F (36.9 C) (Oral)  Resp 19  Ht 5\' 2"  (1.575 m)  Wt 186 lb 14.4 oz (84.777 kg)  BMI 34.18 kg/m2  Gen: NAD; Appears her stated age. No scleral icterus HEENT: PERRL; EOMi.  OP clear of lesions CV: RR: S1S2 present; No murmurs Lungs: CTAB/L; No wheezes or rhonchi Abdominal: scars well healed.  Soft/Non-tender/non-distended. no organomegaly ormasses palpable.   Ext: No peripheral edema. Neuro: Non-focal. Gait normal. Skin: No rashes  Labs: CBC    Component Value Date/Time   WBC 6.7 10/14/2012 1434   WBC 21.0* 03/21/2012 1247   RBC 4.54 10/14/2012 1434   RBC 4.72 03/21/2012 1247   HGB 13.1 10/14/2012 1434   HGB 14.1 03/21/2012 1247   HCT 39.1 10/14/2012 1434   HCT 40.4 03/21/2012 1247   PLT 275 10/14/2012 1434   PLT 271 03/21/2012 1247   MCV 86.1 10/14/2012 1434   MCV 85.6 03/21/2012 1247   MCH 28.9 10/14/2012 1434   MCH 29.9 03/21/2012 1247   MCHC 33.5 10/14/2012 1434   MCHC 34.9 03/21/2012 1247   RDW 13.3 10/14/2012 1434   RDW 13.2 03/21/2012 1247   LYMPHSABS 2.2 10/14/2012 1434   LYMPHSABS 1.2 03/21/2012 1247   MONOABS 0.5 10/14/2012 1434   MONOABS 1.1* 03/21/2012 1247   EOSABS 0.2 10/14/2012 1434   EOSABS 0.0 03/21/2012 1247   BASOSABS 0.1 10/14/2012 1434   BASOSABS 0.0 03/21/2012 1247    IMAGING STUDIES:  (09/30/2012) CLINICAL DATA: Followup appendiceal carcinoid.  EXAM:  CT ABDOMEN AND PELVIS WITH CONTRAST  TECHNIQUE:  Multidetector CT imaging of the  abdomen and pelvis was performed using the standard protocol following bolus administration of intravenous contrast.  CONTRAST: OMNIPAQUE IOHEXOL 300 MG/ML SOLN  COMPARISON: Abdominal pelvic CT 03/21/2012.  FINDINGS:  There is interval improved aeration of the lung bases. No significant pleural or pericardial effusion is present.  Hepatic cysts are stable. The gallbladder, biliary system, pancreas and spleen appear normal.  Bilateral  renal cysts and nonobstructing left renal calculi are again noted. There is no hydronephrosis. There is no adrenal mass. A  retro aortic left renal vein is noted. There is no significant atherosclerosis. Mild soft tissue stranding surrounding the celiac  trunk is unchanged.  Patient has undergone interval appendectomy. No residual or recurrent inflammatory process is seen within the right lower  quadrant. Small ileocolic mesenteric lymph nodes are not pathologically enlarged. The terminal ileum and cecum appear normal.  These bladder and ovaries appear normal. There is asymmetric prominence of the cervix on the left with suspicion of an  approximately 4.0 cm lesion on image 78, possibly a cervical fibroid. The appearance is similar to the prior study. There is no  fluid within the endometrial canal. There is no ascites or peritoneal nodularity.  There are no worrisome osseous findings.  IMPRESSION:  1. Interval appendectomy. No evidence of residual right lower quadrant mass, ascites or peritoneal nodularity.  2. Asymmetric fullness of the cervix on the left is nonspecific, although similar to the prior study and possibly due to a cervical  fibroid. Consider pelvic ultrasound and/or gynecologic examination for further evaluation. No adnexal mass.  3. Stable hepatic and renal cysts.  IMPRESSION AND PLAN:   1. 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 This was a well differentiated neuroendocrine tumor (carcinoid), tubular type, measuring 1.5 cm in greatest dimension. There was focal visceral surface involvement as well as lymphovascular invasion. The proximal margin was negative. Tumor stage was pT1b pMX MX. -- Based on a recent scan and the location and size of the appendiceal carcinoid (less than 2 cm), negative proximal margin, which favors a low probability of recurrence, I recommended to observe clinically as supported by the  2.2014 NCCN guidelines.  I provided her a handout of symptoms of carcinoid syndrome such as flushing, nausea, vomiting, etc.    2. Asymmetric fullness of the cervix. -- I reviewed the report in depth with the patient who reports about 15 years ago she had a similar finding.  She requested to follow up with her PCP (Dr. Delaney Meigs) for a referral for gynecologic examinations and/or pelvic ultrasound.  She denies any bleeding or vaginal discharge.    RTC prn.   Patient understood to call with questions or concerns and that she can request an appointment with any new symptoms as indicated above.

## 2012-11-10 ENCOUNTER — Other Ambulatory Visit (HOSPITAL_COMMUNITY): Payer: Self-pay | Admitting: Adult Health Nurse Practitioner

## 2012-11-10 DIAGNOSIS — D219 Benign neoplasm of connective and other soft tissue, unspecified: Secondary | ICD-10-CM

## 2012-11-15 ENCOUNTER — Ambulatory Visit (HOSPITAL_COMMUNITY): Payer: PRIVATE HEALTH INSURANCE

## 2012-11-22 ENCOUNTER — Ambulatory Visit (HOSPITAL_COMMUNITY)
Admission: RE | Admit: 2012-11-22 | Discharge: 2012-11-22 | Disposition: A | Discharge status: Home / Self Care | Payer: PRIVATE HEALTH INSURANCE | Source: Ambulatory Visit | Attending: Adult Health Nurse Practitioner | Admitting: Adult Health Nurse Practitioner

## 2012-11-22 DIAGNOSIS — R1909 Other intra-abdominal and pelvic swelling, mass and lump: Secondary | ICD-10-CM | POA: Insufficient documentation

## 2012-11-22 DIAGNOSIS — D219 Benign neoplasm of connective and other soft tissue, unspecified: Secondary | ICD-10-CM

## 2012-11-22 DIAGNOSIS — R9389 Abnormal findings on diagnostic imaging of other specified body structures: Secondary | ICD-10-CM | POA: Insufficient documentation

## 2012-12-06 ENCOUNTER — Telehealth: Payer: Self-pay | Admitting: Gynecology

## 2012-12-06 NOTE — Telephone Encounter (Signed)
LMTCB re: incoming doctor referral

## 2012-12-07 NOTE — Telephone Encounter (Signed)
Scheduled

## 2012-12-16 ENCOUNTER — Other Ambulatory Visit: Payer: Self-pay

## 2013-01-05 ENCOUNTER — Ambulatory Visit (INDEPENDENT_AMBULATORY_CARE_PROVIDER_SITE_OTHER): Payer: PRIVATE HEALTH INSURANCE | Admitting: Obstetrics and Gynecology

## 2013-01-05 ENCOUNTER — Encounter: Payer: Self-pay | Admitting: Obstetrics and Gynecology

## 2013-01-05 VITALS — BP 118/70 | HR 70 | Resp 16 | Ht 63.0 in | Wt 196.0 lb

## 2013-01-05 DIAGNOSIS — D259 Leiomyoma of uterus, unspecified: Secondary | ICD-10-CM

## 2013-01-05 DIAGNOSIS — D260 Other benign neoplasm of cervix uteri: Secondary | ICD-10-CM

## 2013-01-05 NOTE — Patient Instructions (Signed)
Fibroids Fibroids are lumps (tumors) that can occur any place in a woman's body. These lumps are not cancerous. Fibroids vary in size, weight, and where they grow. HOME CARE  Do not take aspirin.  Write down the number of pads or tampons you use during your period. Tell your doctor. This can help determine the best treatment for you. GET HELP RIGHT AWAY IF:  You have pain in your lower belly (abdomen) that is not helped with medicine.  You have cramps that are not helped with medicine.  You have more bleeding between or during your period.  You feel lightheaded or pass out (faint).  Your lower belly pain gets worse. MAKE SURE YOU:  Understand these instructions.  Will watch your condition.  Will get help right away if you are not doing well or get worse. Document Released: 03/01/2010 Document Revised: 04/21/2011 Document Reviewed: 03/01/2010 [employer] Patient Information 2014 [employer].  

## 2013-01-05 NOTE — Progress Notes (Signed)
Patient ID: Tara Glover, female   DOB: 08/07/1955, 57 y.o.   MRN: [id number] GYNECOLOGY PROBLEM VISIT  PCP:   Joette Catching, MD  Referring provider:   HPI: 57 y.o.   Single  Caucasian  female   G0P0 with Patient's last menstrual period was 02/10/2002.   here for  Abnormal pelvic ultrasound revealing possible fibroid. Pelvic ultrasound scan through [facility] on 11/22/12 noted uterus 5.2 x 2.1 x 3.2 cm.  Solid mass in left posterior upper cervix and lower uterine segment measuring 4.2 x 1.9 x 3.2 cm, most likely a fibroid.  EMS 2.1 mm.  No focal abnormality noted.  Right ovary not visualized.  Left ovary not visualized.  No adnexal masses. They recommend follow up ultrasound in 4 - 6 months.   CT scan 10/01/12 preceded this as follow of of appendiceal carcinoid.  CT showed asymmetric prominence of the cervix on the left with suspicion of a 4.0 cm cervical fibroid. Similar appearance to a prior study.  No fluid in endmetrial canal.  No ascites.  Patient had appendiceal carcinoid diagnosed in February 2014 when had a ruptured appendix.  Had a laparoscopic appendectomy by Dr. Chevis Pretty.  No follow up treatment needed.   Patient does not have regular gynecologic visits.   Has exams every 5 years. Never took hormonal therapy.  Had a pelvic ultrasound in the past through Dr. Lysbeth Galas. This was ordered for something found on pelvic exam.  Denies menopausal bleeding.   Urine dip - negative.   GYNECOLOGIC HISTORY: Patient's last menstrual period was 02/10/2002. Sexually active: no  Partner preference: never sexually active "no interest" when asked Contraception:   postmenopausal Menopausal hormone therapy: none DES exposure: no   Blood transfusions:   no Sexually transmitted diseases:   no GYN Procedures:  no Mammogram:  10/2012 [facility]             Pap: 10/2012 wnl:Dr. Nyland's office   History of abnormal pap smear:  no   OB History   Grav Para Term Preterm Abortions TAB SAB Ect Mult  Living   0                  Family History  Problem Relation Age of Onset  . Heart disease Mother   . Diabetes Mother   . Hypertension Mother   . Heart disease Father   . Colon cancer Neg Hx   . Hypertension Sister   . Thyroid disease Sister     Patient Active Problem List   Diagnosis Date Noted  . 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 Asthma     exercise induced  . Cancer 2014    carcinoid of appendix  . Kidney stone on left side     Past Surgical History  Procedure Laterality Date  . Knee surgery    . Laparoscopic appendectomy N/A 03/21/2012    Procedure: APPENDECTOMY LAPAROSCOPIC;  Surgeon: Robyne Askew, MD;  Location: [facility] OR;  Service: General;  Laterality: N/A;  . Colonoscopy N/A 06/02/2012    Procedure: COLONOSCOPY;  Surgeon: Malissa Hippo, MD;  [facility];  Service: Endoscopy;  Laterality: N/A;  200-moved to 100 Ann to notify pt  . Appendectomy  03/2012    cardinoid tumor of appendix    ALLERGIES: Review of patient's allergies indicates no known allergies.  No current outpatient prescriptions on file.   No current facility-administered medications for this  visit.     ROS:  Pertinent items are noted in HPI.  SOCIAL HISTORY:  Horticulturist, commercial.   PHYSICAL EXAMINATION:    BP 118/70  Pulse 70  Resp 16  Ht 5\' 3"  (1.6 m)  Wt 196 lb (88.905 kg)  BMI 34.73 kg/m2  LMP 02/10/2002   Wt Readings from Last 3 Encounters:  01/05/13 196 lb (88.905 kg)  10/14/12 186 lb 14.4 oz (84.777 kg)  07/02/12 186 lb 14.4 oz (84.777 kg)     Ht Readings from Last 3 Encounters:  01/05/13 5\' 3"  (1.6 m)  10/14/12 5\' 2"  (1.575 m)  07/02/12 5\' 2"  (1.575 m)    General appearance: alert, cooperative and appears stated age Head: Normocephalic, without obvious abnormality, atraumatic.  Alopecia.  Neck: no adenopathy, supple, symmetrical, trachea midline and thyroid not enlarged, symmetric, no tenderness/mass/nodules Lungs:  clear to auscultation bilaterally Heart: regular rate and rhythm Abdomen: soft, non-tender; no masses,  no organomegaly No abnormal inguinal nodes palpated Skin:  Right medial thigh with 1.5 cm soft subcutaneous mass consistent with probable lipoma.  Neurologic: Grossly normal  Pelvic: External genitalia:  no lesions              Urethra:  normal appearing urethra with no masses, tenderness or lesions              Bartholins and Skenes: normal                 Vagina: normal appearing vagina with normal color and discharge, no lesions - Very thin Pederson used.               Cervix: normal appearance                Bimanual Exam:  Uterus:  uterus is normal size, shape, consistency and nontender                                      Adnexa: normal adnexa in size, nontender and no masses                                      Rectovaginal: Confirms                                      Anus:  normal sphincter tone, no lesions  ASSESSMENT  Probable cervical fibroid.  Status post laparoscopic appendectomy with diagnosis of carcinoid.    PLAN  Counseled on uterine fibroids.  Verbal and written information provided. Will get ultrasound from Dr. Joyce Copa office from several years ago and report back to the patient.  Annual exam in beginning of November 2014.    An After Visit Summary was printed and given to the patient.  30 minutes face to face time of which over 50% was spent in counseling.

## 2013-01-16 ENCOUNTER — Other Ambulatory Visit: Payer: Self-pay | Admitting: Obstetrics and Gynecology

## 2013-01-16 DIAGNOSIS — D219 Benign neoplasm of connective and other soft tissue, unspecified: Secondary | ICD-10-CM

## 2013-01-16 NOTE — Progress Notes (Signed)
Records received from Dr. Joyce Copa office with a message that they do not have a prior ultrasound from years ago.    Patient's ultrasound through [facility] recommended a four month ultrasound recheck, which we will do in our office in February.

## 2013-01-19 ENCOUNTER — Telehealth: Payer: Self-pay | Admitting: Obstetrics and Gynecology

## 2013-01-19 NOTE — Telephone Encounter (Signed)
LMTCB

## 2013-01-21 NOTE — Telephone Encounter (Signed)
Patient does not want to schedule 4 month repeat PUS due in February due to the possibility of snow in the forecast.

## 2013-01-21 NOTE — Telephone Encounter (Signed)
Patient choice. She may do an ultrasound in March 2015 instead of February.

## 2013-01-24 NOTE — Telephone Encounter (Signed)
LMTCB to schedule March PUS

## 2013-04-08 ENCOUNTER — Telehealth: Payer: Self-pay | Admitting: Obstetrics and Gynecology

## 2013-04-08 NOTE — Telephone Encounter (Signed)
Patient is caliing Tara Glover back

## 2013-04-08 NOTE — Telephone Encounter (Signed)
Left message for patient to call back. Need to move PUS after 10:30 on 03.12.2015

## 2013-04-08 NOTE — Telephone Encounter (Signed)
Patient is calling sabrina back °

## 2013-04-11 NOTE — Telephone Encounter (Signed)
Left message for patient to call back  

## 2013-04-21 ENCOUNTER — Other Ambulatory Visit: Payer: PRIVATE HEALTH INSURANCE | Admitting: Obstetrics and Gynecology

## 2013-04-21 ENCOUNTER — Other Ambulatory Visit: Payer: PRIVATE HEALTH INSURANCE

## 2013-04-28 ENCOUNTER — Other Ambulatory Visit: Payer: PRIVATE HEALTH INSURANCE | Admitting: Obstetrics and Gynecology

## 2013-04-28 ENCOUNTER — Telehealth: Payer: Self-pay | Admitting: Obstetrics and Gynecology

## 2013-04-28 ENCOUNTER — Other Ambulatory Visit: Payer: PRIVATE HEALTH INSURANCE

## 2013-04-28 NOTE — Telephone Encounter (Signed)
Patient rescheduled 03/26 @ 1030. Thanks.

## 2013-04-28 NOTE — Telephone Encounter (Signed)
Please do contact patient to reschedule the appointment.

## 2013-04-28 NOTE — Telephone Encounter (Signed)
Patient [facility] her PUS appointment today. Charged $50.00. Gabriel Cirri took call to reschedule her appointment.

## 2013-05-05 ENCOUNTER — Encounter: Payer: Self-pay | Admitting: Obstetrics and Gynecology

## 2013-05-05 ENCOUNTER — Ambulatory Visit (INDEPENDENT_AMBULATORY_CARE_PROVIDER_SITE_OTHER): Payer: PRIVATE HEALTH INSURANCE

## 2013-05-05 ENCOUNTER — Ambulatory Visit (INDEPENDENT_AMBULATORY_CARE_PROVIDER_SITE_OTHER): Payer: PRIVATE HEALTH INSURANCE | Admitting: Obstetrics and Gynecology

## 2013-05-05 VITALS — BP 124/76 | HR 60 | Ht 63.0 in | Wt 193.0 lb

## 2013-05-05 DIAGNOSIS — D259 Leiomyoma of uterus, unspecified: Secondary | ICD-10-CM

## 2013-05-05 DIAGNOSIS — D219 Benign neoplasm of connective and other soft tissue, unspecified: Secondary | ICD-10-CM

## 2013-05-05 NOTE — Progress Notes (Signed)
58 y.o. Single Caucasian female  G0P0 with Patient's last menstrual period was 02/10/2002.  here for follow up pelvic ultrasound.  Had prior abnormal pelvic ultrasound revealing possible fibroid. Pelvic ultrasound scan through [facility] on 11/22/12 noted uterus 5.2 x 2.1 x 3.2 cm. Solid mass in left posterior upper cervix and lower uterine segment measuring 4.2 x 1.9 x 3.2 cm, most likely a fibroid. EMS 2.1 mm. No focal abnormality noted. Right ovary not visualized. Left ovary not visualized. No adnexal masses. They recommend follow up ultrasound in 4 - 6 months.   CT scan 10/01/12 preceded this as follow of of appendiceal carcinoid. CT showed asymmetric prominence of the cervix on the left with suspicion of a 4.0 cm cervical fibroid. Similar appearance to a prior study. No fluid in endmetrial canal. No ascites.  Last pap in September 2014 - Dr. Edrick Oh - normal.  Objective  Ultrasound report and images personally reviewed by me and the patient.   Uterus with 3.2 cm cervical fibroid, EMS 2.27 mm, normal ovaries, no free fluid.      Assessment  Stable cervical fibroid by 3 imaging studies over the last year.  Normal cervical cancer screening with PCP last year.   Plan  Discussion regarding fibroid, etiology, symptoms, low risk of cancer, and treatment options such as hysterectomy if enlarges or becomes symptomatic. Questions answered.  Call for vaginal bleeding, urinary pressure, rectal pressure, or pelvic pain.  Annual examination in November 2015.   15 minutes face to face time of which over 50% was spent in counseling.  After visit summary to patient.

## 2013-05-05 NOTE — Patient Instructions (Signed)
Call for vaginal bleeding, urinary pressure, rectal pressure, or pelvic pain.

## 2013-10-06 ENCOUNTER — Encounter: Payer: Self-pay | Admitting: Obstetrics and Gynecology

## 2013-12-15 ENCOUNTER — Ambulatory Visit: Payer: PRIVATE HEALTH INSURANCE | Admitting: Obstetrics and Gynecology

## 2014-02-17 ENCOUNTER — Encounter: Payer: Self-pay | Admitting: Obstetrics and Gynecology

## 2014-02-17 ENCOUNTER — Ambulatory Visit (INDEPENDENT_AMBULATORY_CARE_PROVIDER_SITE_OTHER): Payer: Managed Care, Other (non HMO) | Admitting: Obstetrics and Gynecology

## 2014-02-17 VITALS — BP 98/62 | HR 64 | Resp 16 | Ht 61.75 in | Wt 180.0 lb

## 2014-02-17 DIAGNOSIS — Z01419 Encounter for gynecological examination (general) (routine) without abnormal findings: Secondary | ICD-10-CM

## 2014-02-17 NOTE — Patient Instructions (Signed)

## 2014-02-17 NOTE — Progress Notes (Signed)
Patient ID: Tara Glover, female   DOB: [birth year], 59 y.o.   MRN: [id number] 58 y.o. G0P0 SingleCaucasianF here for annual exam.   Treating cholesterol and borderline elevated glucose through PCP. No vaginal bleeding.   Patient's last menstrual period was 02/10/2002.          Sexually active: No.  The current method of family planning is post menopausal status.    Exercising: No.  exercise Smoker:  no  Health Maintenance: Pap:  10/2012  wnl with Dr. Murrell Redden office History of abnormal Pap:  no MMG:  2015 neg per patient.  [employer].  Colonoscopy:  05-12-12 polyp--tubular adenoma with Dr. Laural Golden in [place].  Next colonoscopy 05/2022. BMD:  Yrs ago TDaP:  2014 Screening Labs: none, Hb today: none, Urine today: none Self breast exam: done occ Shingles vaccine:  Done about 2 years ago.    reports that she has never smoked. She does not have any smokeless tobacco history on file. She reports that she does not drink alcohol or use illicit drugs.  Past Medical History  Diagnosis Date  . Asthma     exercise induced  . Cancer 2014    carcinoid of appendix  . Kidney stone on left side     Past Surgical History  Procedure Laterality Date  . Knee surgery    . Laparoscopic appendectomy N/A 03/21/2012    Procedure: APPENDECTOMY LAPAROSCOPIC;  Surgeon: Merrie Roof, MD;  Location: [facility];  Service: General;  Laterality: N/A;  . Colonoscopy N/A 06/02/2012    Procedure: COLONOSCOPY;  Surgeon: Rogene Houston, MD;  [facility];  Service: Endoscopy;  Laterality: N/A;  200-moved to 100 Ann to notify pt  . Appendectomy  03/2012    cardinoid tumor of appendix    Current Outpatient Prescriptions  Medication Sig Dispense Refill  . simvastatin (ZOCOR) 20 MG tablet daily.    . Vitamin D, Ergocalciferol, (DRISDOL) 50000 UNITS CAPS capsule once a week.     No current facility-administered medications for this visit.    Family History  Problem Relation Age of Onset  . Heart  disease Mother   . Diabetes Mother   . Hypertension Mother   . Heart disease Father   . Colon cancer Neg Hx   . Hypertension Sister   . Thyroid disease Sister     ROS:  Pertinent items are noted in HPI.  Otherwise, a comprehensive ROS was negative.  Exam:   BP 98/62 mmHg  Pulse 64  Resp 16  Ht 5' 1.75" (1.568 m)  Wt 180 lb (81.647 kg)  BMI 33.21 kg/m2  LMP 02/10/2002      Height: 5' 1.75" (156.8 cm)  Ht Readings from Last 3 Encounters:  02/17/14 5' 1.75" (1.568 m)  05/05/13 5\' 3"  (1.6 m)  01/05/13 5\' 3"  (1.6 m)    General appearance: alert, cooperative and appears stated age Head: Normocephalic, without obvious abnormality, atraumatic Neck: no adenopathy, supple, symmetrical, trachea midline and thyroid normal to inspection and palpation Lungs: clear to auscultation bilaterally Breasts: normal appearance, no masses or tenderness, Inspection negative, No nipple retraction or dimpling, No nipple discharge or bleeding, No axillary or supraclavicular adenopathy Heart: regular rate and rhythm Abdomen: soft, non-tender; bowel sounds normal; no masses,  no organomegaly Extremities: extremities normal, atraumatic, no cyanosis or edema Skin: Skin color, texture, turgor normal. No rashes or lesions. 2 dark brown nevi of the mid back - 4 - 5 mm each.  Lymph nodes:  Cervical, supraclavicular, and axillary nodes normal. No abnormal inguinal nodes palpated Neurologic: Grossly normal   Pelvic: External genitalia:  no lesions              Urethra:  normal appearing urethra with no masses, tenderness or lesions              Bartholins and Skenes: normal                 Vagina: normal appearing vagina with normal color and discharge, no lesions              Cervix: no lesions              Pap taken: Yes.   Bimanual Exam:  Uterus:  normal size, contour, position, consistency, mobility, non-tender              Adnexa: normal adnexa and no mass, fullness, tenderness                Rectovaginal: Confirms               Anus:  normal sphincter tone, no lesions  Chaperone was present for exam.  A:  Well Woman with normal exam Known cervical fibroid.  Not palpable on exam.  Pigmented nevi.   P:   Mammogram yearly.  Will get report from last mammogram. pap smear and HR HPV.  Discussed weight bearing exercise, Ca/vitD. Skin check with dermatology or PCP.  States PCP is doing this as well.  return annually or prn

## 2014-02-23 LAB — IPS PAP TEST WITH HPV

## 2015-02-20 ENCOUNTER — Telehealth: Payer: Self-pay | Admitting: Obstetrics and Gynecology

## 2015-02-20 NOTE — Telephone Encounter (Signed)
Left patient a message to call back when ready to reschedule, canceled by automated reminder call. °

## 2015-02-22 ENCOUNTER — Ambulatory Visit: Payer: Managed Care, Other (non HMO) | Admitting: Obstetrics and Gynecology

## 2015-12-14 ENCOUNTER — Emergency Department (HOSPITAL_COMMUNITY)
Admission: EM | Admit: 2015-12-14 | Discharge: 2015-12-14 | Disposition: A | Discharge status: Home / Self Care | Payer: Managed Care, Other (non HMO) | Attending: Emergency Medicine | Admitting: Emergency Medicine

## 2015-12-14 ENCOUNTER — Encounter (HOSPITAL_COMMUNITY): Payer: Self-pay | Admitting: Emergency Medicine

## 2015-12-14 ENCOUNTER — Emergency Department (HOSPITAL_COMMUNITY): Payer: Managed Care, Other (non HMO)

## 2015-12-14 DIAGNOSIS — J45909 Unspecified asthma, uncomplicated: Secondary | ICD-10-CM | POA: Diagnosis not present

## 2015-12-14 DIAGNOSIS — R1032 Left lower quadrant pain: Secondary | ICD-10-CM | POA: Diagnosis present

## 2015-12-14 DIAGNOSIS — N201 Calculus of ureter: Secondary | ICD-10-CM | POA: Insufficient documentation

## 2015-12-14 DIAGNOSIS — Z86012 Personal history of benign carcinoid tumor: Secondary | ICD-10-CM | POA: Diagnosis not present

## 2015-12-14 DIAGNOSIS — N23 Unspecified renal colic: Secondary | ICD-10-CM

## 2015-12-14 DIAGNOSIS — Z7984 Long term (current) use of oral hypoglycemic drugs: Secondary | ICD-10-CM | POA: Diagnosis not present

## 2015-12-14 LAB — CBC WITH DIFFERENTIAL/PLATELET
Basophils Absolute: 0 10*3/uL (ref 0.0–0.1)
Basophils Relative: 0 %
EOS PCT: 0 %
Eosinophils Absolute: 0 10*3/uL (ref 0.0–0.7)
HCT: 42.1 % (ref 36.0–46.0)
HEMOGLOBIN: 14.3 g/dL (ref 12.0–15.0)
LYMPHS ABS: 1 10*3/uL (ref 0.7–4.0)
LYMPHS PCT: 6 %
MCH: 29.4 pg (ref 26.0–34.0)
MCHC: 34 g/dL (ref 30.0–36.0)
MCV: 86.4 fL (ref 78.0–100.0)
Monocytes Absolute: 0.6 10*3/uL (ref 0.1–1.0)
Monocytes Relative: 4 %
Neutro Abs: 14.4 10*3/uL — ABNORMAL HIGH (ref 1.7–7.7)
Neutrophils Relative %: 90 %
Platelets: 249 10*3/uL (ref 150–400)
RBC: 4.87 MIL/uL (ref 3.87–5.11)
RDW: 12.9 % (ref 11.5–15.5)
WBC: 16 10*3/uL — AB (ref 4.0–10.5)

## 2015-12-14 LAB — BASIC METABOLIC PANEL
Anion gap: 7 (ref 5–15)
BUN: 25 mg/dL — AB (ref 6–20)
CHLORIDE: 108 mmol/L (ref 101–111)
CO2: 27 mmol/L (ref 22–32)
Calcium: 10.1 mg/dL (ref 8.9–10.3)
Creatinine, Ser: 1.1 mg/dL — ABNORMAL HIGH (ref 0.44–1.00)
GFR calc Af Amer: >60 mL/min (ref >60)
GFR calc non Af Amer: 53 mL/min — ABNORMAL LOW (ref >60)
Glucose, Bld: 141 mg/dL — ABNORMAL HIGH (ref 65–99)
POTASSIUM: 4.3 mmol/L (ref 3.5–5.1)
SODIUM: 142 mmol/L (ref 135–145)

## 2015-12-14 LAB — URINE MICROSCOPIC-ADD ON

## 2015-12-14 LAB — URINALYSIS, ROUTINE W REFLEX MICROSCOPIC
Bilirubin Urine: NEGATIVE
GLUCOSE, UA: NEGATIVE mg/dL
Ketones, ur: NEGATIVE mg/dL
Leukocytes, UA: NEGATIVE
Nitrite: NEGATIVE
Protein, ur: 30 mg/dL — AB
SPECIFIC GRAVITY, URINE: 1.026 (ref 1.005–1.030)
pH: 6 (ref 5.0–8.0)

## 2015-12-14 MED ORDER — HYDROCODONE-ACETAMINOPHEN 5-325 MG PO TABS: 1.0000 | ORAL_TABLET | Freq: Four times a day (QID) | ORAL | 0 refills | Status: DC | PRN

## 2015-12-14 MED ORDER — TAMSULOSIN HCL 0.4 MG PO CAPS: 0.4000 mg | ORAL_CAPSULE | Freq: Every day | ORAL | 0 refills | Status: DC

## 2015-12-14 NOTE — ED Notes (Signed)
Patient at CT

## 2015-12-14 NOTE — ED Triage Notes (Signed)
Pt here for lower back pain with radiation to front in lower abd to left side starting today

## 2015-12-14 NOTE — ED Provider Notes (Signed)
[facility] DEPT Provider Note   CSN: [id number] Arrival date & time: 12/14/15  0957     History   Chief Complaint Chief Complaint  Patient presents with  . Abdominal Pain  . Back Pain    HPI Tara Glover is a 60 y.o. female.  The history is provided by the patient. No language interpreter was used.  Abdominal Pain    Back Pain   Associated symptoms include abdominal pain.    Tara Glover is a 60 y.o. female who presents to the Emergency Department complaining of left flank pain. She reports left flank pain that began today. Pain is sharp and waxing and waning in nature. She also reports a mild pain in the left lower quadrant she describes as just to the left side of her bladder. She has associated nausea and chills with the sensation that she needs to urinate. No fevers, vomiting, numbness, weakness, dysuria, hematuria. No prior similar symptoms. Her pain has significantly improved on arrival to the emergency department.  Past Medical History:  Diagnosis Date  . Asthma    exercise induced  . 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 Kidney stone on left side     Patient Active Problem List   Diagnosis Date Noted  . Uterine fibroid 05/05/2013  . 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 APPENDECTOMY  03/2012   cardinoid tumor of appendix  . COLONOSCOPY N/A 06/02/2012   Procedure: COLONOSCOPY;  Surgeon: Rogene Houston, MD;  Location: [facility];  Service: Endoscopy;  Laterality: N/A;  200-moved to 100 Ann to notify pt  . KNEE SURGERY    . LAPAROSCOPIC APPENDECTOMY N/A 03/21/2012   Procedure: APPENDECTOMY LAPAROSCOPIC;  Surgeon: Merrie Roof, MD;  Location: [facility];  Service: General;  Laterality: N/A;    OB History    Gravida Para Term Preterm AB Living   0             SAB TAB Ectopic Multiple Live Births                   Home Medications    Prior to Admission medications   Medication Sig Start  Date End Date Taking? Authorizing Provider  atorvastatin (LIPITOR) 40 MG tablet Take 40 mg by mouth daily. 08/30/15 08/29/16 Yes Historical Provider, MD  cholecalciferol (VITAMIN D) 1000 units tablet Take 1,000 Units by mouth daily.   Yes Historical Provider, MD  Coenzyme Q10 (CO Q 10) 100 MG CAPS Take 100 mg by mouth daily.   Yes Historical Provider, MD  metFORMIN (GLUCOPHAGE) 500 MG tablet Take 500 mg by mouth daily with breakfast.   Yes Historical Provider, MD  metFORMIN (GLUCOPHAGE-XR) 500 MG 24 hr tablet Take 500 mg by mouth daily with breakfast.   Yes Historical Provider, MD  HYDROcodone-acetaminophen (NORCO/VICODIN) 5-325 MG tablet Take 1 tablet by mouth every 6 (six) hours as needed. 12/14/15   Quintella Reichert, MD  simvastatin (ZOCOR) 20 MG tablet daily. 02/13/14   Historical Provider, MD  tamsulosin (FLOMAX) 0.4 MG CAPS capsule Take 1 capsule (0.4 mg total) by mouth daily. 12/14/15   Quintella Reichert, MD  Vitamin D, Ergocalciferol, (DRISDOL) 50000 UNITS CAPS capsule once a week. 02/09/14   Historical Provider, MD    Family History Family History  Problem Relation Age of Onset  . Heart disease Mother     pacemaker  . Diabetes Mother   .  Hypertension Mother   . Heart disease Father   . Colon cancer Neg Hx   . Hypertension Sister   . Thyroid disease Sister   . Heart disease Sister     Social History Social History  Substance Use Topics  . Smoking status: Never Smoker  . Smokeless tobacco: Not on file  . Alcohol use No     Allergies   Review of patient's allergies indicates no known allergies.   Review of Systems Review of Systems  Gastrointestinal: Positive for abdominal pain.  Musculoskeletal: Positive for back pain.  All other systems reviewed and are negative.    Physical Exam Updated Vital Signs BP 124/61   Pulse 61   Temp 97.9 F (36.6 C) (Oral)   Resp 18   LMP 02/10/2002   SpO2 97%   Physical Exam  Constitutional: She is oriented to person, place, and  time. She appears well-developed and well-nourished.  HENT:  Head: Normocephalic and atraumatic.  Cardiovascular: Normal rate and regular rhythm.   No murmur heard. Pulmonary/Chest: Effort normal and breath sounds normal. No respiratory distress.  Abdominal: Soft. There is no tenderness. There is no rebound and no guarding.  Musculoskeletal: She exhibits no edema or tenderness.  Neurological: She is alert and oriented to person, place, and time.  Skin: Skin is warm and dry.  Psychiatric: She has a normal mood and affect. Her behavior is normal.  Nursing note and vitals reviewed.    ED Treatments / Results  Labs (all labs ordered are listed, but only abnormal results are displayed) Labs Reviewed  BASIC METABOLIC PANEL - Abnormal; Notable for the following:       Result Value   Glucose, Bld 141 (*)    BUN 25 (*)    Creatinine, Ser 1.10 (*)    GFR calc non Af Amer 53 (*)    All other components within normal limits  CBC WITH DIFFERENTIAL/PLATELET - Abnormal; Notable for the following:    WBC 16.0 (*)    Neutro Abs 14.4 (*)    All other components within normal limits  URINALYSIS, ROUTINE W REFLEX MICROSCOPIC (NOT AT [facility]) - Abnormal; Notable for the following:    APPearance HAZY (*)    Hgb urine dipstick LARGE (*)    Protein, ur 30 (*)    All other components within normal limits  URINE MICROSCOPIC-ADD ON - Abnormal; Notable for the following:    Squamous Epithelial / LPF 0-5 (*)    Bacteria, UA FEW (*)    All other components within normal limits  URINE CULTURE    EKG  EKG Interpretation None       Radiology Ct Renal Stone Study  Result Date: 12/14/2015 CLINICAL DATA:  Left flank pain, left lower quadrant pain since this morning. EXAM: CT ABDOMEN AND PELVIS WITHOUT CONTRAST TECHNIQUE: Multidetector CT imaging of the abdomen and pelvis was performed following the standard protocol without IV contrast. COMPARISON:  None. FINDINGS: Lower chest: Lung bases are clear. No  effusions. Heart is normal size. Hepatobiliary: 3.3 cm cyst in the posterior right hepatic lobe. No biliary duct dilatation other focal hepatic abnormality. Probable gallstones within the gallbladder. Pancreas: No focal abnormality or ductal dilatation. Spleen: No focal abnormality.  Normal size. Adrenals/Urinary Tract: Small low-density nodule in the right adrenal gland is most compatible with small adenoma. Small 2-3 mm distal left ureteral stone with mild left hydronephrosis. No renal or ureteral stones on the right. Urinary bladder is decompressed, grossly unremarkable. Stomach/Bowel: Stomach, large  and small bowel grossly unremarkable. Vascular/Lymphatic: No evidence of aneurysm or adenopathy. Reproductive: Uterus and adnexa unremarkable.  No mass. Other: No free fluid or free air. Musculoskeletal: No acute bony abnormality or focal bone lesion. IMPRESSION: 2-3 mm distal left ureteral stone with mild left hydronephrosis. Probable cholelithiasis. Electronically Signed   By: Rolm Baptise M.D.   On: 12/14/2015 12:07    Procedures Procedures (including critical care time)  Medications Ordered in ED Medications - No data to display   Initial Impression / Assessment and Plan / ED Course  I have reviewed the triage vital signs and the nursing notes.  Pertinent labs & imaging results that were available during my care of the patient were reviewed by me and considered in my medical decision making (see chart for details).  Clinical Course    Pt here with left flank, LLQ pain.  CT stone study demonstrates left ureteral stone. UA not consistent with UTI. She declines pain medications in the emergency department. Plan to DC home with outpatient neurology/PCP follow-up with close return precautions.  Final Clinical Impressions(s) / ED Diagnoses   Final diagnoses:  Renal colic on left side    New Prescriptions Discharge Medication List as of 12/14/2015  1:32 PM    START taking these medications    Details  HYDROcodone-acetaminophen (NORCO/VICODIN) 5-325 MG tablet Take 1 tablet by mouth every 6 (six) hours as needed., Starting Fri 12/14/2015, Print    tamsulosin (FLOMAX) 0.4 MG CAPS capsule Take 1 capsule (0.4 mg total) by mouth daily., Starting Fri 12/14/2015, Print         Quintella Reichert, MD 12/15/15 [phone]

## 2015-12-15 LAB — URINE CULTURE

## 2016-01-22 ENCOUNTER — Other Ambulatory Visit: Payer: Self-pay | Admitting: Urology

## 2016-01-31 ENCOUNTER — Encounter (HOSPITAL_BASED_OUTPATIENT_CLINIC_OR_DEPARTMENT_OTHER): Payer: Self-pay | Admitting: *Deleted

## 2016-01-31 NOTE — Progress Notes (Signed)
NPO AFTER MN.  ARRIVE AT 0715.  NEEDS EKG.  GETTING LAB WORK DONE Friday 02-01-2016 (CBC, BMET).  MAY TAKE HYDROCODONE AM DOS W/ SIPS OF WATER IF NEEDED.

## 2016-02-01 DIAGNOSIS — Z6831 Body mass index (BMI) 31.0-31.9, adult: Secondary | ICD-10-CM | POA: Diagnosis not present

## 2016-02-01 DIAGNOSIS — N201 Calculus of ureter: Secondary | ICD-10-CM | POA: Diagnosis not present

## 2016-02-01 DIAGNOSIS — E669 Obesity, unspecified: Secondary | ICD-10-CM | POA: Diagnosis not present

## 2016-02-01 DIAGNOSIS — J45909 Unspecified asthma, uncomplicated: Secondary | ICD-10-CM | POA: Diagnosis not present

## 2016-02-01 LAB — CBC
HCT: 39.6 % (ref 36.0–46.0)
Hemoglobin: 13.4 g/dL (ref 12.0–15.0)
MCH: 29.5 pg (ref 26.0–34.0)
MCHC: 33.8 g/dL (ref 30.0–36.0)
MCV: 87.2 fL (ref 78.0–100.0)
PLATELETS: 256 10*3/uL (ref 150–400)
RBC: 4.54 MIL/uL (ref 3.87–5.11)
RDW: 13 % (ref 11.5–15.5)
WBC: 5.9 10*3/uL (ref 4.0–10.5)

## 2016-02-01 LAB — BASIC METABOLIC PANEL
Anion gap: 8 (ref 5–15)
BUN: 25 mg/dL — AB (ref 6–20)
CHLORIDE: 105 mmol/L (ref 101–111)
CO2: 28 mmol/L (ref 22–32)
CREATININE: 0.91 mg/dL (ref 0.44–1.00)
Calcium: 9.6 mg/dL (ref 8.9–10.3)
GFR calc Af Amer: >60 mL/min (ref >60)
GFR calc non Af Amer: >60 mL/min (ref >60)
Glucose, Bld: 106 mg/dL — ABNORMAL HIGH (ref 65–99)
Potassium: 4.5 mmol/L (ref 3.5–5.1)
Sodium: 141 mmol/L (ref 135–145)

## 2016-02-07 ENCOUNTER — Ambulatory Visit (HOSPITAL_BASED_OUTPATIENT_CLINIC_OR_DEPARTMENT_OTHER): Payer: Managed Care, Other (non HMO) | Admitting: Anesthesiology

## 2016-02-07 ENCOUNTER — Encounter (HOSPITAL_BASED_OUTPATIENT_CLINIC_OR_DEPARTMENT_OTHER): Payer: Self-pay | Admitting: *Deleted

## 2016-02-07 ENCOUNTER — Encounter (HOSPITAL_BASED_OUTPATIENT_CLINIC_OR_DEPARTMENT_OTHER)
Admission: RE | Disposition: A | Discharge status: Home / Self Care | Payer: Self-pay | Source: Ambulatory Visit | Attending: Urology

## 2016-02-07 ENCOUNTER — Ambulatory Visit (HOSPITAL_BASED_OUTPATIENT_CLINIC_OR_DEPARTMENT_OTHER)
Admission: RE | Admit: 2016-02-07 | Discharge: 2016-02-07 | Disposition: A | Discharge status: Home / Self Care | Payer: Managed Care, Other (non HMO) | Source: Ambulatory Visit | Attending: Urology | Admitting: Urology

## 2016-02-07 ENCOUNTER — Other Ambulatory Visit: Payer: Self-pay

## 2016-02-07 DIAGNOSIS — N201 Calculus of ureter: Secondary | ICD-10-CM | POA: Insufficient documentation

## 2016-02-07 DIAGNOSIS — Z6831 Body mass index (BMI) 31.0-31.9, adult: Secondary | ICD-10-CM | POA: Insufficient documentation

## 2016-02-07 DIAGNOSIS — J45909 Unspecified asthma, uncomplicated: Secondary | ICD-10-CM | POA: Insufficient documentation

## 2016-02-07 DIAGNOSIS — E669 Obesity, unspecified: Secondary | ICD-10-CM | POA: Insufficient documentation

## 2016-02-07 HISTORY — DX: Presence of spectacles and contact lenses: Z97.3

## 2016-02-07 HISTORY — DX: Leiomyoma of uterus, unspecified: D25.9

## 2016-02-07 HISTORY — DX: Calculus of ureter: N20.1

## 2016-02-07 HISTORY — DX: Exercise induced bronchospasm: J45.990

## 2016-02-07 HISTORY — PX: CYSTOSCOPY WITH RETROGRADE PYELOGRAM, URETEROSCOPY AND STENT PLACEMENT: SHX5789

## 2016-02-07 HISTORY — DX: Personal history of other specified conditions: Z87.898

## 2016-02-07 HISTORY — DX: Personal history of malignant neoplasm, unspecified: Z85.9

## 2016-02-07 HISTORY — DX: Personal history of traumatic brain injury: Z87.820

## 2016-02-07 LAB — POCT I-STAT 4, (NA,K, GLUC, HGB,HCT)
Glucose, Bld: 85 mg/dL (ref 65–99)
HCT: 41 % (ref 36.0–46.0)
Hemoglobin: 13.9 g/dL (ref 12.0–15.0)
Potassium: 4.3 mmol/L (ref 3.5–5.1)
SODIUM: 142 mmol/L (ref 135–145)

## 2016-02-07 LAB — GLUCOSE, CAPILLARY: GLUCOSE-CAPILLARY: 88 mg/dL (ref 65–99)

## 2016-02-07 SURGERY — CYSTOURETEROSCOPY, WITH RETROGRADE PYELOGRAM AND STENT INSERTION
Anesthesia: General | Site: Renal | Laterality: Left

## 2016-02-07 MED ORDER — ACETAMINOPHEN 500 MG PO TABS
1000.0000 mg | ORAL_TABLET | ORAL | Status: AC
  Administered 2016-02-07: 1000 mg via ORAL
  Filled 2016-02-07: qty 2

## 2016-02-07 MED ORDER — IOHEXOL 300 MG/ML  SOLN
INTRAMUSCULAR | Status: DC | PRN
  Administered 2016-02-07: 1 mL

## 2016-02-07 MED ORDER — LACTATED RINGERS IV SOLN
INTRAVENOUS | Status: DC
  Administered 2016-02-07 (×2): via INTRAVENOUS
  Filled 2016-02-07: qty 1000

## 2016-02-07 MED ORDER — OXYCODONE HCL 5 MG/5ML PO SOLN
5.0000 mg | Freq: Once | ORAL | Status: DC | PRN
  Filled 2016-02-07: qty 5

## 2016-02-07 MED ORDER — FENTANYL CITRATE (PF) 100 MCG/2ML IJ SOLN
INTRAMUSCULAR | Status: AC
  Filled 2016-02-07: qty 2

## 2016-02-07 MED ORDER — OXYCODONE HCL 5 MG PO TABS
5.0000 mg | ORAL_TABLET | Freq: Once | ORAL | Status: DC | PRN
  Filled 2016-02-07: qty 1

## 2016-02-07 MED ORDER — ONDANSETRON HCL 4 MG/2ML IJ SOLN
INTRAMUSCULAR | Status: AC
  Filled 2016-02-07: qty 2

## 2016-02-07 MED ORDER — CEFAZOLIN SODIUM-DEXTROSE 2-4 GM/100ML-% IV SOLN
2.0000 g | INTRAVENOUS | Status: AC
  Administered 2016-02-07: 2 g via INTRAVENOUS
  Filled 2016-02-07: qty 100

## 2016-02-07 MED ORDER — PHENAZOPYRIDINE HCL 200 MG PO TABS: 200.0000 mg | ORAL_TABLET | Freq: Three times a day (TID) | ORAL | 3 refills | Status: DC | PRN

## 2016-02-07 MED ORDER — DEXAMETHASONE SODIUM PHOSPHATE 10 MG/ML IJ SOLN
INTRAMUSCULAR | Status: AC
  Filled 2016-02-07: qty 1

## 2016-02-07 MED ORDER — PROPOFOL 10 MG/ML IV BOLUS
INTRAVENOUS | Status: DC | PRN
  Administered 2016-02-07: 150 mg via INTRAVENOUS

## 2016-02-07 MED ORDER — BELLADONNA ALKALOIDS-OPIUM 16.2-60 MG RE SUPP
RECTAL | Status: AC
  Filled 2016-02-07: qty 1

## 2016-02-07 MED ORDER — KETOROLAC TROMETHAMINE 30 MG/ML IJ SOLN
INTRAMUSCULAR | Status: AC
  Filled 2016-02-07: qty 1

## 2016-02-07 MED ORDER — BELLADONNA ALKALOIDS-OPIUM 16.2-60 MG RE SUPP
RECTAL | Status: DC | PRN
  Administered 2016-02-07: 1 via RECTAL

## 2016-02-07 MED ORDER — PROPOFOL 10 MG/ML IV BOLUS
INTRAVENOUS | Status: AC
  Filled 2016-02-07: qty 20

## 2016-02-07 MED ORDER — KETOROLAC TROMETHAMINE 30 MG/ML IJ SOLN
INTRAMUSCULAR | Status: DC | PRN
  Administered 2016-02-07: 30 mg via INTRAVENOUS

## 2016-02-07 MED ORDER — ACETAMINOPHEN 500 MG PO TABS
ORAL_TABLET | ORAL | Status: AC
  Filled 2016-02-07: qty 2

## 2016-02-07 MED ORDER — ONDANSETRON HCL 4 MG/2ML IJ SOLN
4.0000 mg | Freq: Four times a day (QID) | INTRAMUSCULAR | Status: DC | PRN
  Filled 2016-02-07: qty 2

## 2016-02-07 MED ORDER — CEFAZOLIN SODIUM-DEXTROSE 2-4 GM/100ML-% IV SOLN
INTRAVENOUS | Status: AC
  Filled 2016-02-07: qty 100

## 2016-02-07 MED ORDER — MIDAZOLAM HCL 2 MG/2ML IJ SOLN
INTRAMUSCULAR | Status: AC
  Filled 2016-02-07: qty 2

## 2016-02-07 MED ORDER — MIDAZOLAM HCL 5 MG/5ML IJ SOLN
INTRAMUSCULAR | Status: DC | PRN
  Administered 2016-02-07: 2 mg via INTRAVENOUS

## 2016-02-07 MED ORDER — FENTANYL CITRATE (PF) 100 MCG/2ML IJ SOLN
25.0000 ug | INTRAMUSCULAR | Status: DC | PRN
  Filled 2016-02-07: qty 1

## 2016-02-07 MED ORDER — LIDOCAINE 2% (20 MG/ML) 5 ML SYRINGE
INTRAMUSCULAR | Status: DC | PRN
  Administered 2016-02-07: 70 mg via INTRAVENOUS

## 2016-02-07 MED ORDER — DEXAMETHASONE SODIUM PHOSPHATE 4 MG/ML IJ SOLN
INTRAMUSCULAR | Status: DC | PRN
  Administered 2016-02-07: 10 mg via INTRAVENOUS

## 2016-02-07 MED ORDER — TRAMADOL-ACETAMINOPHEN 37.5-325 MG PO TABS: 1.0000 | ORAL_TABLET | Freq: Four times a day (QID) | ORAL | 0 refills | Status: DC | PRN

## 2016-02-07 MED ORDER — ONDANSETRON HCL 4 MG/2ML IJ SOLN
INTRAMUSCULAR | Status: DC | PRN
  Administered 2016-02-07: 4 mg via INTRAVENOUS

## 2016-02-07 MED ORDER — SODIUM CHLORIDE 0.9 % IR SOLN
Status: DC | PRN
  Administered 2016-02-07: 4000 mL

## 2016-02-07 MED ORDER — FENTANYL CITRATE (PF) 100 MCG/2ML IJ SOLN
INTRAMUSCULAR | Status: DC | PRN
  Administered 2016-02-07 (×2): 25 ug via INTRAVENOUS

## 2016-02-07 SURGICAL SUPPLY — 38 items
BAG DRAIN URO-CYSTO SKYTR STRL (DRAIN) ×3 IMPLANT
BASKET LASER NITINOL 1.9FR (BASKET) IMPLANT
BASKET STNLS GEMINI 4WIRE 3FR (BASKET) IMPLANT
BASKET ZERO TIP NITINOL 2.4FR (BASKET) ×3 IMPLANT
BOOTIES KNEE HIGH SLOAN (MISCELLANEOUS) ×3 IMPLANT
CATH CLEAR GEL 3F BACKSTOP (CATHETERS) IMPLANT
CATH INTERMIT  6FR 70CM (CATHETERS) ×3 IMPLANT
CATH URET 5FR 28IN CONE TIP (BALLOONS)
CATH URET 5FR 28IN OPEN ENDED (CATHETERS) IMPLANT
CATH URET 5FR 70CM CONE TIP (BALLOONS) IMPLANT
CATH URET DUAL LUMEN 6-10FR 50 (CATHETERS) IMPLANT
CLOTH BEACON ORANGE TIMEOUT ST (SAFETY) ×3 IMPLANT
ELECT REM PT RETURN 9FT ADLT (ELECTROSURGICAL)
ELECTRODE REM PT RTRN 9FT ADLT (ELECTROSURGICAL) IMPLANT
GLOVE BIO SURGEON STRL SZ7 (GLOVE) ×3 IMPLANT
GLOVE BIO SURGEON STRL SZ7.5 (GLOVE) ×3 IMPLANT
GLOVE BIOGEL PI IND STRL 7.5 (GLOVE) ×1 IMPLANT
GLOVE BIOGEL PI INDICATOR 7.5 (GLOVE) ×2
GOWN STRL REUS W/ TWL LRG LVL3 (GOWN DISPOSABLE) ×1 IMPLANT
GOWN STRL REUS W/ TWL XL LVL3 (GOWN DISPOSABLE) ×1 IMPLANT
GOWN STRL REUS W/TWL LRG LVL3 (GOWN DISPOSABLE) ×2
GOWN STRL REUS W/TWL XL LVL3 (GOWN DISPOSABLE) ×8 IMPLANT
GUIDEWIRE 0.038 PTFE COATED (WIRE) IMPLANT
GUIDEWIRE ANG ZIPWIRE 038X150 (WIRE) IMPLANT
GUIDEWIRE STR DUAL SENSOR (WIRE) ×3 IMPLANT
IV NS 1000ML (IV SOLUTION) ×2
IV NS 1000ML BAXH (IV SOLUTION) ×1 IMPLANT
IV NS IRRIG 3000ML ARTHROMATIC (IV SOLUTION) ×3 IMPLANT
KIT BALLIN UROMAX 15FX10 (LABEL) IMPLANT
KIT BALLN UROMAX 15FX4 (MISCELLANEOUS) IMPLANT
KIT BALLN UROMAX 26 75X4 (MISCELLANEOUS)
KIT ROOM TURNOVER WOR (KITS) ×3 IMPLANT
MANIFOLD NEPTUNE II (INSTRUMENTS) IMPLANT
SET HIGH PRES BAL DIL (LABEL)
SHEATH ACCESS URETERAL 38CM (SHEATH) IMPLANT
STENT POLARIS 5FRX22 (STENTS) ×3 IMPLANT
TUBE CONNECTING 12'X1/4 (SUCTIONS)
TUBE CONNECTING 12X1/4 (SUCTIONS) IMPLANT

## 2016-02-07 NOTE — Transfer of Care (Signed)
  Last Vitals:  Vitals:   02/07/16 0719  BP: 125/70  Pulse: 62  Resp: 16  Temp: 36.4 C    Last Pain:  Vitals:   02/07/16 0719  TempSrc: Oral      Patients Stated Pain Goal: 8 (02/07/16 0739) Immediate Anesthesia Transfer of Care Note  Patient: Tara Glover  Procedure(s) Performed: Procedure(s) (LRB): CYSTOSCOPY WITH LEFT  RETROGRADE PYELOGRAM, URETEROSCOPY, POLARIS  STENT PLACEMENT (Left)  Patient Location: PACU  Anesthesia Type: General  Level of Consciousness: awake, alert  and oriented  Airway & Oxygen Therapy: Patient Spontanous Breathing and Patient connected to nasal cannula oxygen  Post-op Assessment: Report given to PACU RN and Post -op Vital signs reviewed and stable  Post vital signs: Reviewed and stable  Complications: No apparent anesthesia complications

## 2016-02-07 NOTE — Op Note (Signed)
Pre-operative diagnosis :  L ureteral calculus  Postoperative diagnosis:  Passed L ureteral stone  Operation: cystourethroscopy, Left retrograde pyelogram with interpretation, Left semi-rigid ureteroscopy, Left 19F x 22 F Polaris stent placement  Findings: No hydronephrosis. No ureteral stone. Normal retrograde pyelogram. Small, tight ureter.   Surgeon:  Chauncey Cruel. Gaynelle Arabian, MD  First assistant: None  Anesthesia:  General LMA  Preparation:  After appropriate pre-anesthesia, the patient was brought to the operative room, placed on the operating table in the dorsal supine position where general LMA anesthesia was introduced. She was then replaced in the dorsal lithotomy position with pubis was prepped with Betadine solution, and draped in the usual fashion. The armband was double checked. The history was double checked.  Review history: ICD-10 Details        59 year old female, with 6 mm left lower ureteral stone. Patient thought she had passed her stone, but she is asymptomatic, and felt that she has a nodule. This was sent for analysis, and urinalysis identified the fact that this was not a stone. (Amorphic tissue).   We have reviewed KUB today, which shows the patient has the same stone that was present on CT scan. I discussed with the patient, and we'll arrange for her to have cystoscopy left retrograde pyelogram, ureteroscopy laser fractionation 6 mm ureterovesical stone.   Statement of  Likelihood of Success: Excellent. TIME-OUT observed.:  Procedure: Fluoroscopic evaluation revealed no evidence of previously identified calcification, seen on KUB in the office, and on previous CT exam. Left retrograde Polygram was performed, which showed a normal-appearing left ureter, with no hydronephrosis, and no Secretions. A 0.038 guidewire was passed into the left renal pelvis, and the double bridge ureteroscope was passed into the intramural ureter. There was resistance at the ureteral orifice, and  therefore, slow dilation of the orifice and intramural ureter was accomplished. No stone was identified in this area with complete inspection. The lower ureter was completely normal, and no hydronephrosis was noted. The mid ureter and upper ureter were normal as well. The renal pelvis appeared normal, and retrograde Polygram showed no evidence of abnormalities of the renal calyces.  I elected to place a 5 French double-J stent, and therefore, a 5 [place] by 22 cm Polaris stent was selected. The ureteroscope was removed, and over the guidewire, a 5 [place] by 22 cm Polaris stent was placed, leaving the suture attached, 4 the patient's removal in 3 days.  The patient received IV Toradol, and was awakened, and taken to recovery room in good condition.

## 2016-02-07 NOTE — H&P (Signed)
Office Visit Report     01/16/2016   --------------------------------------------------------------------------------   Tara Glover  MRN: [id number]  PRIMARY CARE:  Margarita Rana, MD  DOB: 1955/04/02, 60 year old Female  REFERRING:  Quintella Reichert, MD  SSN:   PROVIDER:  Irine Seal, M.D.    TREATING:  Carolan Clines, M.D.    LOCATION:  [facility] [phone]   --------------------------------------------------------------------------------   CC: I have ureteral stone.  HPI: Tara Glover is a 60 year-old female established patient who is here for ureteral stone.  The problem is on the left side. She first stated noticing pain on 12/14/2015. This is her first kidney stone. She is not currently having flank pain, back pain, groin pain, nausea, vomiting, fever or chills. She has not caught a stone in her urine strainer since her symptoms began.   She has never had surgical treatment for calculi in the past.   She was seen in the ER on 12/14/15. CT showed a 2-51mm distal Lt ureteral stone.   She returns today for a KUB to see if Lt ureteral stone is still there. Stone analysis report could not identify stone that patient thought she passed.     ALLERGIES: None   MEDICATIONS: Metformin Hcl 500 mg tablet  Atorvastatin Calcium 40 mg tablet  Coq10  Potassium Citrate Er 15 meq (1,620 mg) tablet, extended release 2 tablet PO BID  Vitamin D3 1,000 unit tablet     GU PSH: None   NON-GU PSH: Appendectomy Colonoscopy Knee Arthroscopy/surgery    GU PMH: Calculus Ureter - 12/19/2015    NON-GU PMH: Asthma Hypercholesterolemia Leiomyoma of uterus, unspecified Malignant carcinoid tumor of the appendix Personal history of other endocrine, nutritional and metabolic disease    FAMILY HISTORY: Diabetes - Mother Heart Disease - Mother, Father, Sister Hypertension - Mother, Sister Thyroid Disease - Sister   SOCIAL HISTORY: Marital Status: Single Current Smoking  Status: Patient has never smoked.  Has never drank.  Drinks 2 caffeinated drinks per day. Patient's occupation Editor, commissioning.    REVIEW OF SYSTEMS:    GU Review Female:   Patient denies frequent urination, hard to postpone urination, burning /pain with urination, get up at night to urinate, leakage of urine, stream starts and stops, trouble starting your stream, have to strain to urinate, and currently pregnant.  Gastrointestinal (Upper):   Patient denies nausea, vomiting, and indigestion/ heartburn.  Gastrointestinal (Lower):   Patient denies diarrhea and constipation.  Constitutional:   Patient denies fever, night sweats, weight loss, and fatigue.  Skin:   Patient denies skin rash/ lesion and itching.  Eyes:   Patient denies blurred vision and double vision.  Ears/ Nose/ Throat:   Patient denies sore throat and sinus problems.  Hematologic/Lymphatic:   Patient denies swollen glands and easy bruising.  Cardiovascular:   Patient denies leg swelling and chest pains.  Respiratory:   Patient denies cough and shortness of breath.  Endocrine:   Patient denies excessive thirst.  Musculoskeletal:   Patient denies back pain and joint pain.  Neurological:   Patient denies headaches and dizziness.  Psychologic:   Patient denies depression and anxiety.   VITAL SIGNS:      01/16/2016 03:35 PM  BP 106/66 mmHg  Pulse 61 /min  Temperature 98.4 F / 37 C   MULTI-SYSTEM PHYSICAL EXAMINATION:    Constitutional: Well-nourished. No physical deformities. Normally developed. Good grooming.  Neck: Neck symmetrical, not swollen. Normal tracheal position.  Respiratory: No  labored breathing, no use of accessory muscles.   Cardiovascular: Normal temperature, normal extremity pulses, no swelling, no varicosities.  Lymphatic: No enlargement of neck, axillae, groin.  Skin: No paleness, no jaundice, no cyanosis. No lesion, no ulcer, no rash.  Neurologic / Psychiatric: Oriented to time, oriented to  place, oriented to person. No depression, no anxiety, no agitation.  Gastrointestinal: No mass, no tenderness, no rigidity, non obese abdomen.  Eyes: Normal conjunctivae. Normal eyelids.  Ears, Nose, Mouth, and Throat: Left ear no scars, no lesions, no masses. Right ear no scars, no lesions, no masses. Nose no scars, no lesions, no masses. Normal hearing. Normal lips.  Musculoskeletal: Normal gait and station of head and neck.     PAST DATA REVIEWED:  Source Of History:  Patient   01/16/16  Urinalysis  Urine Appearance Clear   Urine Color Yellow   Urine Glucose Neg   Urine Bilirubin Neg   Urine Ketones Neg   Urine Specific Gravity 1.010   Urine Blood Neg   Urine pH 8.0   Urine Protein Trace   Urine Urobilinogen 0.2   Urine Nitrites Neg   Urine Leukocyte Esterase Neg    PROCEDURES:         KUB - 74000  A single view of the abdomen is obtained.               Urinalysis Dipstick Dipstick Cont'd  Color: Yellow Bilirubin: Neg  Appearance: Clear Ketones: Neg  Specific Gravity: 1.010 Blood: Neg  pH: 8.0 Protein: Trace  Glucose: Neg Urobilinogen: 0.2    Nitrites: Neg    Leukocyte Esterase: Neg    ASSESSMENT:      ICD-10 Details  1 GU:   Calculus Ureter - N20.1           Notes:   60 year old female, with 6 mm left lower ureteral stone. Patient thought she had passed her stone, but she is asymptomatic, and felt that she has a nodule. This was sent for analysis, and urinalysis identified the fact that this was not a stone. (Amorphic tissue).   We have reviewed KUB today, which shows the patient has the same stone that was present on CT scan. I discussed with the patient, and we'll arrange for her to have cystoscopy left retrograde pyelogram, ureteroscopy laser fractionation 6 mm ureterovesical stone.    PLAN:           Document Letter(s):  Created for Patient: Clinical Summary    Signed by Carolan Clines, M.D. on 01/16/16 at 4:48 PM (EST)     The information  contained in this medical record document is considered private and confidential patient information. This information can only be used for the medical diagnosis and/or medical services that are being provided by the patient's selected caregivers. This information can only be distributed outside of the patient's care if the patient agrees and signs waivers of authorization for this information to be sent to an outside source or route.

## 2016-02-07 NOTE — Discharge Instructions (Addendum)
[facility] °[phone] °Post Ureteroscopy With or Without Stent Instructions ° °Definitions: ° °Ureter: The duct that transports urine from the kidney to the bladder. °Stent:   A plastic hollow tube that is placed into the ureter, from the kidney to the                 bladder to prevent the ureter from swelling shut. ° °GENERAL INSTRUCTIONS: ° °Despite the fact that no skin incisions were used, the area around the ureter and bladder is raw and irritated. The stent is a foreign body which will further irritate the bladder wall. This irritation is manifested by increased frequency of urination, both day and night, and by an increase in the urge to urinate. In some, the urge to urinate is present almost always. Sometimes the urge is strong enough that you may not be able to stop yourself from urinating. The only real cure is to remove the stent and then give time for the bladder wall to heal which can't be done until the danger of the ureter swelling shut has passed, which varies. ° °You may see some blood in your urine while the stent is in place and a few days afterwards. Do not be alarmed, even if the urine was clear for a while. Get off your feet and drink lots of fluids until clearing occurs. If you start to pass clots or don't improve, call us. ° °DIET: °You may return to your normal diet immediately. Because of the raw surface of your bladder, alcohol, spicy foods, acid type foods and drinks with caffeine may cause irritation or frequency and should be used in moderation. To keep your urine flowing freely and to avoid constipation, drink plenty of fluids during the day ( 8-10 glasses ). °Tip: Avoid cranberry juice because it is very acidic. ° °ACTIVITY: °Your physical activity doesn't need to be restricted. However, if you are very active, you may see some blood in your urine. We suggest that you reduce your activity under these circumstances until the bleeding has stopped. ° °BOWELS: °It is  important to keep your bowels regular during the postoperative period. Straining with bowel movements can cause bleeding. A bowel movement every other day is reasonable. Use a mild laxative if needed, such as Milk of Magnesia 2-3 tablespoons, or 2 Dulcolax tablets. Call if you continue to have problems. If you have been taking narcotics for pain, before, during or after your surgery, you may be constipated. Take a laxative if necessary. ° ° °MEDICATION: °You should resume your pre-surgery medications unless told not to. In addition you will often be given an antibiotic to prevent infection. These should be taken as prescribed until the bottles are finished unless you are having an unusual reaction to one of the drugs. ° °PROBLEMS YOU SHOULD REPORT TO US: °· Fevers over 100.5 Fahrenheit. °· Heavy bleeding, or clots ( See above notes about blood in urine ). °· Inability to urinate. °· Drug reactions ( hives, rash, nausea, vomiting, diarrhea ). °· Severe burning or pain with urination that is not improving. ° °FOLLOW-UP: °You will need a follow-up appointment to monitor your progress. Call for this appointment at the number listed above. Usually the first appointment will be about three to fourteen days after your surgery. ° ° ° ° ° °Post Anesthesia Home Care Instructions ° °Activity: °Get plenty of rest for the remainder of the day. A responsible adult should stay with you for 24 hours following the procedure.  °  For the next 24 hours, DO NOT: -Drive a car -Paediatric nurse -Drink alcoholic beverages -Take any medication unless instructed by your physician -Make any legal decisions or sign important papers.  Meals: Start with liquid foods such as gelatin or soup. Progress to regular foods as tolerated. Avoid greasy, spicy, heavy foods. If nausea and/or vomiting occur, drink only clear liquids until the nausea and/or vomiting subsides. Call your physician if vomiting continues.  Special  Instructions/Symptoms: Your throat may feel dry or sore from the anesthesia or the breathing tube placed in your throat during surgery. If this causes discomfort, gargle with warm salt water. The discomfort should disappear within 24 hours.  If you had a scopolamine patch placed behind your ear for the management of post- operative nausea and/or vomiting:  1. The medication in the patch is effective for 72 hours, after which it should be removed.  Wrap patch in a tissue and discard in the trash. Wash hands thoroughly with soap and water. 2. You may remove the patch earlier than 72 hours if you experience unpleasant side effects which may include dry mouth, dizziness or visual disturbances. 3. Avoid touching the patch. Wash your hands with soap and water after contact with the patch.   DISCHARGE INSTRUCTIONS FOR KIDNEY STONES OR URETERAL STENT  MEDICATIONS:   1. DO NOT RESUME YOUR ASPIRIN, or any other medicines like ibuprofen, motrin, excedrin, advil, aleve, vitamin E, fish oil as these can all cause bleeding x 7 days.  2. Resume all your other meds from home - except do not take any other pain meds that you may have at home.  ACTIVITY 1. No strenuous activity x 1week 2. No driving while on narcotic pain medications 3. Drink plenty of water 4. Continue to walk at home - you can still get blood clots when you are at home, so keep active, but don't over do it. 5. May return to work in 3 days.  BATHING 1. You can shower and we recommend daily showers  2. If you have a string coming from your urethra:  The stent string is attached to your ureteral stent.  Do not pull on this.   SIGNS/SYMPTOMS TO CALL: 1. Please call us if you have a fever greater than 101.5, uncontrolled  nausea/vomiting, uncontrolled pain, dizziness, unable to urinate, bloody urine, chest pain, shortness of breath, leg swelling, leg pain, redness around wound, drainage from wound, or any other concerns or  questions.  You can reach [place] at [phone].  FOLLOW-UP You have an appointment: call [phone] for appointment for stent removal.    You may feel an odd sensation in your back. OK for occasional blood in the urine.

## 2016-02-07 NOTE — Anesthesia Postprocedure Evaluation (Signed)
Anesthesia Post Note  Patient: JAMILLA SHINGLEDECKER  Procedure(s) Performed: Procedure(s) (LRB): CYSTOSCOPY WITH LEFT  RETROGRADE PYELOGRAM, URETEROSCOPY, POLARIS  STENT PLACEMENT (Left)  Patient location during evaluation: PACU Anesthesia Type: General Level of consciousness: awake, awake and alert and oriented Pain management: pain level controlled Vital Signs Assessment: post-procedure vital signs reviewed and stable Respiratory status: spontaneous breathing, nonlabored ventilation and respiratory function stable Cardiovascular status: blood pressure returned to baseline Postop Assessment: no headache Anesthetic complications: no       Last Vitals:  Vitals:   02/07/16 0915 02/07/16 0930  BP: 139/66 119/67  Pulse: 72 (!) 44  Resp: 15 16  Temp:      Last Pain:  Vitals:   02/07/16 0914  TempSrc:   PainSc: 0-No pain                 Ankit Degregorio COKER

## 2016-02-07 NOTE — Anesthesia Procedure Notes (Addendum)
Procedure Name: LMA Insertion Date/Time: 02/07/2016 8:33 AM Performed by: Wanita Chamberlain Pre-anesthesia Checklist: Patient identified, Emergency Drugs available, Suction available, Patient being monitored and Timeout performed Patient Re-evaluated:Patient Re-evaluated prior to inductionOxygen Delivery Method: Circle system utilized Preoxygenation: Pre-oxygenation with 100% oxygen Intubation Type: IV induction Ventilation: Mask ventilation without difficulty LMA: LMA inserted LMA Size: 4.0 Number of attempts: 1 Airway Equipment and Method: Bite block Placement Confirmation: positive ETCO2,  CO2 detector and breath sounds checked- equal and bilateral Tube secured with: Tape Dental Injury: Teeth and Oropharynx as per pre-operative assessment

## 2016-02-07 NOTE — Anesthesia Preprocedure Evaluation (Addendum)
Anesthesia Evaluation  Patient identified by MRN, date of birth, ID band Patient awake    Reviewed: Allergy & Precautions, H&P , NPO status , Patient's Chart, lab work & pertinent test results  Airway Mallampati: II  TM Distance: >3 FB Neck ROM: full    Dental  (+) Teeth Intact, Dental Advisory Given   Pulmonary asthma ,    breath sounds clear to auscultation       Cardiovascular negative cardio ROS   Rhythm:regular Rate:Normal     Neuro/Psych    GI/Hepatic   Endo/Other  obese  Renal/GU stones     Musculoskeletal   Abdominal   Peds  Hematology   Anesthesia Other Findings   Reproductive/Obstetrics                            Anesthesia Physical Anesthesia Plan  ASA: II  Anesthesia Plan: General   Post-op Pain Management:    Induction: Intravenous  Airway Management Planned: LMA  Additional Equipment:   Intra-op Plan:   Post-operative Plan:   Informed Consent: I have reviewed the patients History and Physical, chart, labs and discussed the procedure including the risks, benefits and alternatives for the proposed anesthesia with the patient or authorized representative who has indicated his/her understanding and acceptance.     Plan Discussed with: CRNA, Anesthesiologist and Surgeon  Anesthesia Plan Comments:         Anesthesia Quick Evaluation

## 2016-02-07 NOTE — Interval H&P Note (Signed)
History and Physical Interval Note:  02/07/2016 8:15 AM  Tara Glover  has presented today for surgery, with the diagnosis of LEFT URETERAL STONE  The various methods of treatment have been discussed with the patient and family. After consideration of risks, benefits and other options for treatment, the patient has consented to  Procedure(s): CYSTOSCOPY WITH RETROGRADE PYELOGRAM, URETEROSCOPY, LASER OF [place] (N/A) as a surgical intervention .  The patient's history has been reviewed, patient examined, no change in status, stable for surgery.  I have reviewed the patient's chart and labs.  Questions were answered to the patient's satisfaction.     Tara Glover

## 2016-02-08 ENCOUNTER — Encounter (HOSPITAL_BASED_OUTPATIENT_CLINIC_OR_DEPARTMENT_OTHER): Payer: Self-pay | Admitting: Urology

## 2016-12-03 ENCOUNTER — Telehealth: Payer: Self-pay | Admitting: Obstetrics and Gynecology

## 2016-12-03 NOTE — Telephone Encounter (Addendum)
Patient called stating she has been having pelvic pressure and she may need an appointment for this.   She is also past due for her AEX and scheduled that with Dr. Quincy Simmonds on 12/08/16.  Last seen: 02/17/14

## 2016-12-03 NOTE — Telephone Encounter (Signed)
I am happy to see the patient for her appointment on 12/08/16. If she needs a problem visit, she can be seen sooner.

## 2016-12-03 NOTE — Telephone Encounter (Signed)
Spoke with patient. Patient states that she has been experiencing pelvic pressure for 2 months. Originally felt that this could be a kidney stone. Has increased fluid intake. "I feel like the pressure is different. I am not having sharp pain." Denies any urinary symptoms, lower back pain, fever, or chills. States she has a history of fibroids. Wondering if this could be causing symptoms. Patient has an aex scheduled for 12/08/2016. Advised will review with Dr.Silva and return call.

## 2016-12-03 NOTE — Telephone Encounter (Signed)
Spoke with patient. Advised of message as seen below from [place]. Patient verbalizes understanding. Patient would like to wait until her aex. Does not feel it is an urgent need for another visit. Wants to move appointment forward due to work schedule. Appointment moved to 12/05/2016 at 3:30 pm with Dr.Silva. Patient is agreeable to date and time.  Routing to provider for final review. Patient agreeable to disposition. Will close encounter.

## 2016-12-05 ENCOUNTER — Encounter: Payer: Self-pay | Admitting: Obstetrics and Gynecology

## 2016-12-05 ENCOUNTER — Other Ambulatory Visit (HOSPITAL_COMMUNITY)
Admission: RE | Admit: 2016-12-05 | Discharge: 2016-12-05 | Disposition: A | Discharge status: Home / Self Care | Payer: Managed Care, Other (non HMO) | Source: Ambulatory Visit | Attending: Obstetrics and Gynecology | Admitting: Obstetrics and Gynecology

## 2016-12-05 ENCOUNTER — Ambulatory Visit (INDEPENDENT_AMBULATORY_CARE_PROVIDER_SITE_OTHER): Payer: Managed Care, Other (non HMO) | Admitting: Obstetrics and Gynecology

## 2016-12-05 ENCOUNTER — Other Ambulatory Visit: Payer: Self-pay | Admitting: Obstetrics and Gynecology

## 2016-12-05 VITALS — BP 108/60 | HR 60 | Resp 18 | Ht 62.0 in | Wt 184.6 lb

## 2016-12-05 DIAGNOSIS — R1032 Left lower quadrant pain: Secondary | ICD-10-CM | POA: Diagnosis not present

## 2016-12-05 DIAGNOSIS — Z86018 Personal history of other benign neoplasm: Secondary | ICD-10-CM

## 2016-12-05 DIAGNOSIS — Z01419 Encounter for gynecological examination (general) (routine) without abnormal findings: Secondary | ICD-10-CM

## 2016-12-05 DIAGNOSIS — D3A020 Benign carcinoid tumor of the appendix: Secondary | ICD-10-CM

## 2016-12-05 DIAGNOSIS — Z1231 Encounter for screening mammogram for malignant neoplasm of breast: Secondary | ICD-10-CM

## 2016-12-05 NOTE — Patient Instructions (Signed)

## 2016-12-05 NOTE — Progress Notes (Signed)
Scheduled patient for screening mammogram 12-30-16 3:00pm at [facility]--patient requested date.

## 2016-12-05 NOTE — Progress Notes (Signed)
61 y.o. G0P0 Single Caucasian female here for annual exam.    Pressure in her left lower quadrant and pressure for a couple of months.  No diarrhea, constipation, blood in stool or urine.  No use of pain medication.  No vaginal bleeding.   Labs with PCP.   Will do flu vaccine at work.   PCP:  Dione Housekeeper, MD  Patient's last menstrual period was 02/10/2002.           Sexually active: No.  The current method of family planning is post menopausal status.    Exercising: No.   Smoker:  no  Health Maintenance: Pap: 02-17-14 Neg:Neg HR HPV, 10/2012 Neg with Dr.Nyland History of abnormal Pap:  no MMG:08-22-13 Density B/Neg/BiRads1:[facility]--[place], [place].   Colonoscopy:  05-12-12 polyp--tubular adenoma with Dr. Laural Golden in [place].  Next colonoscopy 05/2022. BMD: years ago  Result : ?Normal TDaP:  2014 Gardasil:   no JYN:WGNFA Hep C:never Screening Labs:  Hb today: PCP, Urine today: not done   reports that she has never smoked. She has never used smokeless tobacco. She reports that she does not drink alcohol or use drugs.  Past Medical History:  Diagnosis Date  . Exercise-induced asthma    and w/ heat--  per pt no inhaler  . Fibroid of cervix   . History of cardiac murmur as a child   . History of concussion    age 18 w/ LOC--  no residual  . 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 Left ureteral stone   . Wears glasses     Past Surgical History:  Procedure Laterality Date  . COLONOSCOPY N/A 06/02/2012   Procedure: COLONOSCOPY;  Surgeon: Rogene Houston, MD;  [facility];  Service: Endoscopy;  Laterality: N/A;  200-moved  to 100 Ann to notify pt  . CYSTOSCOPY WITH RETROGRADE PYELOGRAM, URETEROSCOPY AND STENT PLACEMENT Left 02/07/2016   Procedure: CYSTOSCOPY WITH LEFT  RETROGRADE PYELOGRAM, URETEROSCOPY, POLARIS  STENT PLACEMENT;  Surgeon: Carolan Clines, MD;  Location: [facility];  Service: Urology;  Laterality: Left;  . LAPAROSCOPIC APPENDECTOMY N/A 03/21/2012   Procedure: APPENDECTOMY LAPAROSCOPIC;  Surgeon: Merrie Roof, MD;  Location: [facility];  Service: General;  Laterality: N/A;  . ORIF RIGHT LOWER LEG FX  2007    Current Outpatient Prescriptions  Medication Sig Dispense Refill  . atorvastatin (LIPITOR) 40 MG tablet Take 1 tablet by mouth daily.    . cholecalciferol (VITAMIN D) 1000 units tablet Take 1,000 Units by mouth daily.    . Coenzyme Q10 (CO Q 10) 100 MG CAPS Take 100 mg by mouth daily.    . metFORMIN (GLUCOPHAGE-XR) 500 MG 24 hr tablet Take 500 mg by mouth daily with breakfast.    . Potassium 95 MG TABS Take 2 tablets by mouth every 2 (two) hours.     No current facility-administered medications for this visit.     Family History  Problem Relation Age of Onset  . Heart disease Mother        pacemaker  . Diabetes Mother   . Hypertension Mother   . Heart disease Father   . Hypertension Sister   .  Thyroid disease Sister   . Heart disease Sister   . Colon cancer Neg Hx     ROS:  Pertinent items are noted in HPI.  Otherwise, a comprehensive ROS was negative.  Exam:   BP 108/60 (BP Location: Right Arm, Patient Position: Sitting, Cuff Size: Large)   Pulse 60   Resp 18   Ht 5\' 2"  (1.575 m)   Wt 184 lb 9.6 oz (83.7 kg)   LMP 02/10/2002   BMI 33.76 kg/m     General appearance: alert, cooperative and appears stated age Head: Normocephalic, without obvious abnormality, atraumatic Neck: no adenopathy, supple, symmetrical, trachea midline and thyroid normal to inspection and palpation Lungs: clear to auscultation bilaterally Breasts: normal appearance, no masses or  tenderness, No nipple retraction or dimpling, No nipple discharge or bleeding, No axillary or supraclavicular adenopathy Heart: regular rate and rhythm Abdomen: soft, non-tender; no masses, no organomegaly Extremities: extremities normal, atraumatic, no cyanosis or edema Skin: Skin color, texture, turgor normal. No rashes or lesions Lymph nodes: Cervical, supraclavicular, and axillary nodes normal. No abnormal inguinal nodes palpated Neurologic: Grossly normal  Pelvic: External genitalia:  no lesions              Urethra:  normal appearing urethra with no masses, tenderness or lesions              Bartholins and Skenes: normal                 Vagina: normal appearing vagina with normal color and discharge, no lesions              Cervix: no lesions              Pap taken: Yes.   Bimanual Exam:  Uterus:  normal size, contour, position, consistency, mobility, non-tender              Adnexa: no mass, fullness, tenderness              Rectal exam: Yes.  .  Confirms.              Anus:  normal sphincter tone, no lesions  Chaperone was present for exam.  Assessment:   Well woman visit with normal exam. Hx carcinoid tumor of appendix.  Low malignant potential.  Hx tubular adenoma of colon.  LLQ discomfort.   Uncertain etiology.  Hx cervical fibroid. Hx left ureteral stone.   Plan: Mammogram screening discussed.  We will schedule. Recommended self breast awareness. Pap and HR HPV as above. Guidelines for Calcium, Vitamin D, regular exercise program including cardiovascular and weight bearing exercise. Return for pelvic US. Referral to GI for opinion regarding follow up of carcinoid and coloscopy schedule. Labs with PCP.  She declines checking a urine today for signs of renal stone or infection.   Follow up annually and prn.    After visit summary provided.

## 2016-12-08 ENCOUNTER — Ambulatory Visit: Payer: Managed Care, Other (non HMO) | Admitting: Obstetrics and Gynecology

## 2016-12-08 ENCOUNTER — Encounter: Payer: Self-pay | Admitting: Nurse Practitioner

## 2016-12-09 ENCOUNTER — Telehealth: Payer: Self-pay | Admitting: Obstetrics and Gynecology

## 2016-12-09 LAB — CYTOLOGY - PAP
Diagnosis: NEGATIVE
HPV (WINDOPATH): NOT DETECTED

## 2016-12-09 NOTE — Telephone Encounter (Signed)
Call placed to patient to review benefits for a recommended ultrasound. Called primary number, the mobile number, but voicemail was full. Call placed to the secondary number, the home number.  Left voicemail message on the hime number requesting a return call.

## 2016-12-10 NOTE — Telephone Encounter (Signed)
Patient returned call. Reviewed benefit for ultrasound. Patient understood and agreeable. Patient ready to schedule. Patient scheduled 12/18/16 with Dr Quincy Simmonds. Patient aware of appointment date, arrival time and cancellation policy. No further questions. Ok to close

## 2016-12-18 ENCOUNTER — Telehealth: Payer: Self-pay | Admitting: Obstetrics and Gynecology

## 2016-12-18 ENCOUNTER — Ambulatory Visit (INDEPENDENT_AMBULATORY_CARE_PROVIDER_SITE_OTHER): Payer: Managed Care, Other (non HMO)

## 2016-12-18 ENCOUNTER — Other Ambulatory Visit: Payer: Self-pay

## 2016-12-18 ENCOUNTER — Encounter: Payer: Self-pay | Admitting: Obstetrics and Gynecology

## 2016-12-18 ENCOUNTER — Ambulatory Visit (INDEPENDENT_AMBULATORY_CARE_PROVIDER_SITE_OTHER): Payer: Managed Care, Other (non HMO) | Admitting: Obstetrics and Gynecology

## 2016-12-18 VITALS — BP 124/68 | HR 60 | Ht 62.0 in | Wt 184.0 lb

## 2016-12-18 DIAGNOSIS — R1032 Left lower quadrant pain: Secondary | ICD-10-CM | POA: Diagnosis not present

## 2016-12-18 DIAGNOSIS — Z86018 Personal history of other benign neoplasm: Secondary | ICD-10-CM

## 2016-12-18 DIAGNOSIS — N859 Noninflammatory disorder of uterus, unspecified: Secondary | ICD-10-CM

## 2016-12-18 NOTE — Telephone Encounter (Signed)
Tara Glover this was noted at check out on patients return instructions from [place].  Return for please schedule endometrial biopsy for next Tuesday morning. .I would like to have the procedure room please and 30 minute time slot

## 2016-12-18 NOTE — Progress Notes (Signed)
Encounter reviewed by Dr. Brook Amundson C. Silva.  

## 2016-12-18 NOTE — Progress Notes (Signed)
Patient ID: Tara Glover, female   DOB: 1955/10/30, 61 y.o.   MRN: [id number] GYNECOLOGY  VISIT   HPI: 61 y.o.   Single  Caucasian  female   G0P0 with Patient's last menstrual period was 02/10/2002.here for pelvic ultrasound for LLQ pain.   Denies vaginal bleeding.  has known cervical fibroid.  No vaginal bleeding.   LLQ pain improved.   Has known hx malignant carcinoid tumor.   GYNECOLOGIC HISTORY: Patient's last menstrual period was 02/10/2002. Contraception:  Postmenopausal Menopausal hormone therapy:  no Last mammogram: 08-22-13 Density B/Neg/BiRads1:[facility]--[place], [place]. --[facility]12-30-16 at [facility] Last pap smear:  02-17-14 Neg:Neg HR HPV, 10/2012 Neg with Dr.Nyland        OB History    Gravida Para Term Preterm AB Living   0             SAB TAB Ectopic Multiple Live Births                     Patient Active Problem List   Diagnosis Date Noted  . Uterine fibroid 05/05/2013  . 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 Exercise-induced asthma    and w/ heat--  per pt no inhaler  . Fibroid of cervix   . History of cardiac murmur as a child   . History of concussion    age 48 w/ LOC--  no residual  . 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 Left ureteral stone   . Wears glasses     Past Surgical History:  Procedure Laterality Date  . ORIF RIGHT LOWER LEG FX  2007    Current Outpatient Medications  Medication Sig Dispense Refill  . atorvastatin (LIPITOR) 40 MG tablet Take 1 tablet by mouth daily.    . cholecalciferol (VITAMIN D) 1000 units tablet Take 1,000 Units by mouth daily.    .  Coenzyme Q10 (CO Q 10) 100 MG CAPS Take 100 mg by mouth daily.    . metFORMIN (GLUCOPHAGE-XR) 500 MG 24 hr tablet Take 500 mg by mouth daily with breakfast.    . Potassium 95 MG TABS Take 2 tablets by mouth every 2 (two) hours.     No current facility-administered medications for this visit.      ALLERGIES: Patient has no known allergies.  Family History  Problem Relation Age of Onset  . Heart disease Mother        pacemaker  . Diabetes Mother   . Hypertension Mother   . Heart disease Father   . Hypertension Sister   . Thyroid disease Sister   . Heart disease Sister   . Colon cancer Neg Hx     Social History   Socioeconomic History  . Marital status: Single    Spouse name: Not on file  . Number of children: Not on file  . Years of education: Not on file  . Highest education level: Not on file  Social Needs  . Financial resource strain: Not on file  . Food insecurity - worry: Not on file  . Food insecurity - inability: Not on file  . Transportation needs - medical: Not  on file  . Transportation needs - non-medical: Not on file  Occupational History  . Not on file  Tobacco Use  . Smoking status: Never Smoker  . Smokeless tobacco: Never Used  Substance and Sexual Activity  . Alcohol use: No  . Drug use: No  . Sexual activity: No    Birth control/protection: Post-menopausal  Other Topics Concern  . Not on file  Social History Narrative  . Not on file    ROS:  Pertinent items are noted in HPI.  PHYSICAL EXAMINATION:    BP 124/68 (BP Location: Right Arm, Patient Position: Sitting, Cuff Size: Normal)   Pulse 60   Ht 5\' 2"  (1.575 m)   Wt 184 lb (83.5 kg)   LMP 02/10/2002   BMI 33.65 kg/m     General appearance: alert, cooperative and appears stated age   Pelvic US Fluid in endometrial canal.  EMS 3.42 mm. Cervical fibroid noted:   38 x 21 x 34 mm. Ovaries normal.  No free fluid.  Chaperone was present for exam.  ASSESSMENT  LLQ pain.  Cervical  fibroid.  Endometrial fluid and EMS over 3 mm. Hx carcinoid tumor.   PLAN  Uterine fibroid discussed.  Up to Date literature reviewed regarding endometrial fluid and EMS measurement and possibility for malignancy.  EMB recommended.  Pretx with Motrin 800 mg prior to procedure.  Will plan for paracervical block.  She has already been referred to GI for a second opinion regarding colonoscopy schedule.  An After Visit Summary was printed and given to the patient.  ___15___ minutes face to face time of which over 50% was spent in counseling.

## 2016-12-18 NOTE — Patient Instructions (Signed)
Endometrial Biopsy  Endometrial biopsy is a procedure in which a tissue sample is taken from inside the uterus. The sample is taken from the endometrium, which is the lining of the uterus. The tissue sample is then checked under a microscope to see if the tissue is normal or abnormal. This procedure helps to determine where you are in your menstrual cycle and how hormone levels are affecting the lining of the uterus. This procedure may also be used to evaluate uterine bleeding or to diagnose endometrial cancer, endometrial tuberculosis, polyps, or other inflammatory conditions.  Tell a health care provider about:   Any allergies you have.   All medicines you are taking, including vitamins, herbs, eye drops, creams, and over-the-counter medicines.   Any problems you or family members have had with anesthetic medicines.   Any blood disorders you have.   Any surgeries you have had.   Any medical conditions you have.   Whether you are pregnant or may be pregnant.  What are the risks?  Generally, this is a safe procedure. However, problems may occur, including:   Bleeding.   Pelvic infection.   Puncture of the wall of the uterus with the biopsy device (rare).    What happens before the procedure?   Keep a record of your menstrual cycles as told by your health care provider. You may need to schedule your procedure for a specific time in your cycle.   You may want to bring a sanitary pad to wear after the procedure.   Ask your health care provider about:  ? Changing or stopping your regular medicines. This is especially important if you are taking diabetes medicines or blood thinners.  ? Taking medicines such as aspirin and ibuprofen. These medicines can thin your blood. Do not take these medicines before your procedure if your health care provider instructs you not to.   Plan to have someone take you home from the hospital or clinic.  What happens during the procedure?   To lower your risk of  infection:  ? Your health care team will wash or sanitize their hands.   You will lie on an exam table with your feet and legs supported as in a pelvic exam.   Your health care provider will insert an instrument (speculum) into your vagina to see your cervix.   Your cervix will be cleansed with an antiseptic solution.   A medicine (local anesthetic) will be used to numb the cervix.   A forceps instrument (tenaculum) will be used to hold your cervix steady for the biopsy.   A thin, rod-like instrument (uterine sound) will be inserted through your cervix to determine the length of your uterus and the location where the biopsy sample will be removed.   A thin, flexible tube (catheter) will be inserted through your cervix and into the uterus. The catheter will be used to collect the biopsy sample from your endometrial tissue.   The catheter and speculum will then be removed, and the tissue sample will be sent to a lab for examination.  What happens after the procedure?   You will rest in a recovery area until you are ready to go home.   You may have mild cramping and a small amount of vaginal bleeding. This is normal.   It is up to you to get the results of your procedure. Ask your health care provider, or the department that is doing the procedure, when your results will be ready.  Summary     Endometrial biopsy is a procedure in which a tissue sample is taken from the endometrium, which is the lining of the uterus.   This procedure may help to diagnose menstrual cycle problems, abnormal bleeding, or other conditions affecting the endometrium.   Before the procedure, keep a record of your menstrual cycles as told by your health care provider.   The tissue sample that is removed will be checked under a microscope to see if it is normal or abnormal.  This information is not intended to replace advice given to you by your health care provider. Make sure you discuss any questions you have with your health care  provider.  Document Released: 05/30/2004 Document Revised: 02/13/2016 Document Reviewed: 02/13/2016  Elsevier Interactive Patient Education  2017 [employer]vier Inc[employer]

## 2016-12-18 NOTE — Telephone Encounter (Signed)
Spoke with patient regarding scheduling an endometrial biopsy. Patient understood and agreeable. Patient scheduled, Tuesday morning at 10:30 AM on 12/23/16 with Dr Quincy Simmonds. Patient is aware of appointment date, arrival time and cancellation policy. No further questions. Ok to close   cc: Dr Quincy Simmonds

## 2016-12-23 ENCOUNTER — Ambulatory Visit (INDEPENDENT_AMBULATORY_CARE_PROVIDER_SITE_OTHER): Payer: Managed Care, Other (non HMO) | Admitting: Obstetrics and Gynecology

## 2016-12-23 ENCOUNTER — Other Ambulatory Visit: Payer: Self-pay

## 2016-12-23 ENCOUNTER — Encounter: Payer: Self-pay | Admitting: Obstetrics and Gynecology

## 2016-12-23 DIAGNOSIS — N859 Noninflammatory disorder of uterus, unspecified: Secondary | ICD-10-CM

## 2016-12-23 NOTE — Patient Instructions (Signed)

## 2016-12-23 NOTE — Progress Notes (Signed)
GYNECOLOGY  VISIT   HPI: 61 y.o.   Single  Caucasian  female   G0P0 with Patient's last menstrual period was 02/10/2002.   here for EMB.  She had an ultrasound showing fluid in the endometrial canal. EMS was just over 3 mm.   No bleeding.   GYNECOLOGIC HISTORY: Patient's last menstrual period was 02/10/2002. Contraception:  Postmenopausal Menopausal hormone therapy:  none Last mammogram:  08-22-13 Density B/Neg/BiRads1:[facility] [place].--appt.12-30-16 at [facility] Last pap smear: 12/05/16 Pap and HR HPV negative,  02-17-14 Neg:Neg HR HPV, 10/2012 Neg with Dr.Nyland        OB History    Gravida Para Term Preterm AB Living   0             SAB TAB Ectopic Multiple Live Births                     Patient Active Problem List   Diagnosis Date Noted  . Uterine fibroid 05/05/2013  . 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 Exercise-induced asthma    and w/ heat--  per pt no inhaler  . Fibroid of cervix   . History of cardiac murmur as a child   . History of concussion    age 34 w/ LOC--  no residual  . 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 Left ureteral stone   . Wears glasses     Past Surgical History:  Procedure Laterality Date  . ORIF RIGHT LOWER LEG FX  2007    Current Outpatient Medications  Medication Sig Dispense Refill  . atorvastatin (LIPITOR) 40 MG tablet Take 1 tablet by mouth daily.    . cholecalciferol (VITAMIN D) 1000 units tablet Take 1,000 Units by mouth daily.    . Coenzyme Q10 (CO Q 10) 100 MG CAPS Take 100 mg by mouth daily.    . metFORMIN (GLUCOPHAGE-XR) 500 MG 24 hr  tablet Take 500 mg by mouth daily with breakfast.    . Potassium 95 MG TABS Take 2 tablets by mouth every 2 (two) hours.     No current facility-administered medications for this visit.      ALLERGIES: Patient has no known allergies.  Family History  Problem Relation Age of Onset  . Heart disease Mother        pacemaker  . Diabetes Mother   . Hypertension Mother   . Heart disease Father   . Hypertension Sister   . Thyroid disease Sister   . Heart disease Sister   . Colon cancer Neg Hx     Social History   Socioeconomic History  . Marital status: Single    Spouse name: Not on file  . Number of children: Not on file  . Years of education: Not on file  . Highest education level: Not on file  Social Needs  . Financial resource strain: Not on file  . Food insecurity - worry: Not on file  . Food insecurity - inability: Not on file  . Transportation needs - medical: Not on file  . Transportation needs - non-medical: Not on file  Occupational History  . Not  on file  Tobacco Use  . Smoking status: Never Smoker  . Smokeless tobacco: Never Used  Substance and Sexual Activity  . Alcohol use: No  . Drug use: No  . Sexual activity: No    Birth control/protection: Post-menopausal  Other Topics Concern  . Not on file  Social History Narrative  . Not on file    ROS:  Pertinent items are noted in HPI.  PHYSICAL EXAMINATION:    BP (!) 112/58 (BP Location: Right Arm, Patient Position: Sitting, Cuff Size: Large)   Pulse 60   Resp 16   Wt 174 lb (78.9 kg)   LMP 02/10/2002   BMI 31.83 kg/m     General appearance: alert, cooperative and appears stated age    Pelvic: External genitalia:  no lesions              Urethra:  normal appearing urethra with no masses, tenderness or lesions              Bartholins and Skenes: normal                 Vagina: normal appearing vagina with normal color and discharge, no lesions              Cervix: no lesions                Bimanual  Exam:  Uterus:  normal size, contour, position, consistency, mobility, non-tender              Adnexa: no mass, fullness, tenderness             EMB Consent for procedure.  Sterile prep with Hibiclens.  Paracervical block with 8 cc 1% lidocaine, lot [id number]    ,expiration 11/21. Tenaculum to anterior cervical lip.  Os finder used.  Pipelle to 7 cm x 2.  Tissue to pathology.  No complications.  Minimal EBL. Chaperone was present for exam.  ASSESSMENT  Fluid in endometrial canal.  PLAN  Follow up EMB.  Post biopsy instructions given.  If this EMB is nondiagnostic, I would attribute this to atrophy and no to further work up unless she has bleeding.    An After Visit Summary was printed and given to the patient.

## 2016-12-29 ENCOUNTER — Other Ambulatory Visit: Payer: Managed Care, Other (non HMO)

## 2016-12-29 ENCOUNTER — Ambulatory Visit (INDEPENDENT_AMBULATORY_CARE_PROVIDER_SITE_OTHER): Payer: Managed Care, Other (non HMO) | Admitting: Physician Assistant

## 2016-12-29 ENCOUNTER — Encounter: Payer: Self-pay | Admitting: Physician Assistant

## 2016-12-29 ENCOUNTER — Other Ambulatory Visit (INDEPENDENT_AMBULATORY_CARE_PROVIDER_SITE_OTHER): Payer: Managed Care, Other (non HMO)

## 2016-12-29 ENCOUNTER — Other Ambulatory Visit: Payer: Self-pay

## 2016-12-29 VITALS — BP 100/58 | HR 60 | Ht 62.0 in | Wt 173.5 lb

## 2016-12-29 DIAGNOSIS — Z86010 Personal history of colonic polyps: Secondary | ICD-10-CM | POA: Diagnosis not present

## 2016-12-29 DIAGNOSIS — D3A020 Benign carcinoid tumor of the appendix: Secondary | ICD-10-CM

## 2016-12-29 DIAGNOSIS — R1032 Left lower quadrant pain: Secondary | ICD-10-CM | POA: Diagnosis not present

## 2016-12-29 DIAGNOSIS — D126 Benign neoplasm of colon, unspecified: Secondary | ICD-10-CM | POA: Insufficient documentation

## 2016-12-29 DIAGNOSIS — E875 Hyperkalemia: Secondary | ICD-10-CM

## 2016-12-29 DIAGNOSIS — D125 Benign neoplasm of sigmoid colon: Secondary | ICD-10-CM

## 2016-12-29 LAB — CBC WITH DIFFERENTIAL/PLATELET
BASOS PCT: 1.3 % (ref 0.0–3.0)
Basophils Absolute: 0.1 10*3/uL (ref 0.0–0.1)
EOS PCT: 7.2 % — AB (ref 0.0–5.0)
Eosinophils Absolute: 0.4 10*3/uL (ref 0.0–0.7)
HCT: 41.2 % (ref 36.0–46.0)
Hemoglobin: 13.9 g/dL (ref 12.0–15.0)
Lymphocytes Relative: 34.6 % (ref 12.0–46.0)
Lymphs Abs: 1.7 10*3/uL (ref 0.7–4.0)
MCHC: 33.7 g/dL (ref 30.0–36.0)
MCV: 87.8 fl (ref 78.0–100.0)
MONOS PCT: 7.3 % (ref 3.0–12.0)
Monocytes Absolute: 0.4 10*3/uL (ref 0.1–1.0)
NEUTROS ABS: 2.5 10*3/uL (ref 1.4–7.7)
NEUTROS PCT: 49.6 % (ref 43.0–77.0)
PLATELETS: 257 10*3/uL (ref 150.0–400.0)
RBC: 4.69 Mil/uL (ref 3.87–5.11)
RDW: 13 % (ref 11.5–15.5)
WBC: 5 10*3/uL (ref 4.0–10.5)

## 2016-12-29 LAB — BASIC METABOLIC PANEL
BUN: 14 mg/dL (ref 6–23)
CALCIUM: 10.2 mg/dL (ref 8.4–10.5)
CO2: 30 meq/L (ref 19–32)
Chloride: 106 mEq/L (ref 96–112)
Creatinine, Ser: 0.88 mg/dL (ref 0.40–1.20)
GFR: 69.17 mL/min (ref >60.00)
GLUCOSE: 110 mg/dL — AB (ref 70–99)
POTASSIUM: 5.4 meq/L — AB (ref 3.5–5.1)
SODIUM: 141 meq/L (ref 135–145)

## 2016-12-29 NOTE — Progress Notes (Signed)
I agree with the above note, plan 

## 2016-12-29 NOTE — Progress Notes (Signed)
Subjective:    Patient ID: Tara Glover, female    DOB: [birth year], 61 y.o.   MRN: [id number]  HPI Tara Glover is a pleasant 61 year old white female, new to GI today referred by Josefa Half M.D./GYN for evaluation of left lower quadrant pain. Patient says that she has been having pain over the past 3 months. She says this has not been severe but describes it more as a pressure type discomfort which is worse some days than others but never completely goes away. She has not had any radiation into her back or upper abdomen. She has been concerned about the possibility of diverticulitis and has been fasting some days. She says she does feel a bit better if she just stays on liquids. She has also had some mild improvement after bowel movements. She has not had any changes in her bowel habits, no melena or hematochezia. No fever or chills. Appetite has been good but weight may be down a few pounds because of her intermittent fasting. She denies any dysuria. Recent GYN evaluation per patient is positive for a fibroid and she also had some abnormality of the uterine wall, biopsies were taken and are pending. She has also had problems with ureteral lithiasis and had a stent placed in the left ureter a year or so ago and subsequently removed. Patient did have prior colonoscopy in 2014 per Dr. Laural Golden. She was told that she had one small polyp which was removed and that she needed 10 year interval follow-up. Patient has history of carcinoid tumor of the appendix which was diagnosed at the time of surgery at which time the appendix was ruptured. She was seen in follow-up by oncology but did not require any further treatment.  Review of Systems Pertinent positive and negative review of systems were noted in the above HPI section.  All other review of systems was otherwise negative.  Outpatient Encounter Medications as of 12/29/2016  Medication Sig  . atorvastatin (LIPITOR) 40 MG tablet Take 1 tablet by mouth daily.    . cholecalciferol (VITAMIN D) 1000 units tablet Take 1,000 Units by mouth daily.  . Coenzyme Q10 (CO Q 10) 100 MG CAPS Take 100 mg by mouth daily.  . metFORMIN (GLUCOPHAGE-XR) 500 MG 24 hr tablet Take 500 mg by mouth daily with breakfast.  . Potassium 95 MG TABS Take 2 tablets by mouth every 2 (two) hours.   No facility-administered encounter medications on file as of 12/29/2016.    No Known Allergies Patient Active Problem List   Diagnosis Date Noted  . Uterine fibroid 05/05/2013  . 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 Marital status: Single    Spouse name: Not on file  . Number of children: 0  . Years of education: Not on file  . Highest education level: Not on file  Social Needs  . Financial resource strain: Not on file  . Food insecurity - worry: Not on file  . Food insecurity - inability: Not on file  . Transportation needs - medical: Not on file  . Transportation needs - non-medical: Not on file  Occupational History  . Occupation: Museum/gallery conservator  Tobacco Use  . Smoking status: Never Smoker  . Smokeless tobacco: Never Used  Substance and Sexual Activity  . Alcohol use: No  . Drug use: No  . Sexual activity: No    Birth control/protection: Post-menopausal  Other Topics Concern  . Not on file  Social History Narrative  .  Not on file    Ms. Worsley family history includes Diabetes in her mother; Heart disease in her father, mother, and sister; Hypertension in her mother and sister; Thyroid disease in her sister.      Objective:    Vitals:   12/29/16 0940  BP: (!) 100/58  Pulse: 60    Physical Exam ; well-developed older white female in no acute distress, pleasant blood pressure 100/58, pulse 60, height 5 foot 2, weight 173, BMI of 31.7. HEENT; nontraumatic normocephalic EOMI PERRLA sclera anicteric, Cardiovascular; regular rate and rhythm with S1-S2, Pulmonary ;clear bilaterally, Abdomen; obese, soft, bowel sounds  are present she is tender in the left lower quadrant, there is no guarding or rebound, no palpable mass, no hepatosplenomegaly, Rectal; exam not done, Extremities; no clubbing cyanosis or edema skin warm and dry, Neuropsych; mood and affect appropriate       Assessment & Plan:   #78 61 year old white female with 3 month history of left lower quadrant pain-etiology not clear. Patient has just had GYN evaluation with finding of the uterine fibroid and also had biopsy of the uterus which is pending. Patient with history of left uretero-lithiasis with prior stent. Rule out diverticulosis/smoldering diverticulitis, rule out occult colon lesion, rule out other intra-abdominal inflammatory process #2 history of carcinoid tumor of the appendix 2014-T1 b,pNX,MX  #3 history of tubular adenomatous colon polyp at colonoscopy 2014-was advised 10 year interval follow-up #4 adult-onset diabetes mellitus #5 dyslipidemia  Plan; CBC with differential, BMET Schedule for CT scan of the abdomen and pelvis with IV and oral contrast. We will obtain her records from prior colonoscopy. Further plans pending results of above.  Addendum colonoscopy 4/23/ 2014 per Dr. Laural Golden with finding of a 4 mm polyp at the rectosigmoid junction cold snared and small internal hemorrhoids Path from the polyp consistent with a tubular adenoma.    Amy S Esterwood PA-C 12/29/2016   Cc: Aundria Rud*

## 2016-12-29 NOTE — Patient Instructions (Signed)
Please go to the basement level to have your labs drawn.   You have been scheduled for a CT scan of the abdomen and pelvis at [facility] ([address]---this is in the same building as [facility]).   You are scheduled on Tuesday 12-30-2016  at 10:00 am. You should arrive at 9:45 am  to your appointment time for registration. Please follow the written instructions below on the day of your exam:  WARNING: IF YOU ARE ALLERGIC TO IODINE/X-RAY DYE, PLEASE NOTIFY RADIOLOGY IMMEDIATELY AT [phone]! YOU WILL BE GIVEN A 13 HOUR PREMEDICATION PREP.  1) Do not eat  anything after 6:00 am(4 hours prior to your test) 2) You have been given 2 bottles of oral contrast to drink. The solution may taste               better if refrigerated, but do NOT add ice or any other liquid to this solution. Shake             well before drinking.    Drink 1 bottle of contrast @ 8:00 am (2 hours prior to your exam)  Drink 1 bottle of contrast @ 9:00 am (1 hour prior to your exam)  You may take any medications as prescribed with a small amount of water except for the following: Metformin, Glucophage, Glucovance, Avandamet, Riomet, Fortamet, Actoplus Met, Janumet, Glumetza or Metaglip. The above medications must be held the day of the exam AND 48 hours after the exam.  The purpose of you drinking the oral contrast is to aid in the visualization of your intestinal tract. The contrast solution may cause some diarrhea. Before your exam is started, you will be given a small amount of fluid to drink. Depending on your individual set of symptoms, you may also receive an intravenous injection of x-ray contrast/dye. Plan on being at [facility] for 30 minutes or long, depending on the type of exam you are having performed.  If you have any questions regarding your exam or if you need to reschedule, you may call the CT department at [phone] between the hours of 8:00 am and 5:00 pm,  Monday-Friday.  ________________________________________________________________________

## 2016-12-30 ENCOUNTER — Telehealth: Payer: Self-pay

## 2016-12-30 ENCOUNTER — Ambulatory Visit (INDEPENDENT_AMBULATORY_CARE_PROVIDER_SITE_OTHER)
Admission: RE | Admit: 2016-12-30 | Discharge: 2016-12-30 | Disposition: A | Discharge status: Home / Self Care | Payer: Managed Care, Other (non HMO) | Source: Ambulatory Visit | Attending: Physician Assistant | Admitting: Physician Assistant

## 2016-12-30 ENCOUNTER — Ambulatory Visit
Admission: RE | Admit: 2016-12-30 | Discharge: 2016-12-30 | Disposition: A | Discharge status: Home / Self Care | Payer: Managed Care, Other (non HMO) | Source: Ambulatory Visit | Attending: Obstetrics and Gynecology | Admitting: Obstetrics and Gynecology

## 2016-12-30 DIAGNOSIS — R1032 Left lower quadrant pain: Secondary | ICD-10-CM | POA: Diagnosis not present

## 2016-12-30 DIAGNOSIS — D3A020 Benign carcinoid tumor of the appendix: Secondary | ICD-10-CM | POA: Diagnosis not present

## 2016-12-30 DIAGNOSIS — Z1231 Encounter for screening mammogram for malignant neoplasm of breast: Secondary | ICD-10-CM

## 2016-12-30 MED ORDER — IOPAMIDOL (ISOVUE-300) INJECTION 61%
100.0000 mL | Freq: Once | INTRAVENOUS | Status: AC | PRN
  Administered 2016-12-30: 100 mL via INTRAVENOUS

## 2016-12-30 NOTE — Telephone Encounter (Signed)
Returning a call to [place] .

## 2016-12-30 NOTE — Telephone Encounter (Signed)
Spoke with patient. Results given. Patient verbalizes understanding. Encounter closed. 

## 2016-12-30 NOTE — Telephone Encounter (Signed)
-----   Message from Nunzio Cobbs, MD sent at 12/29/2016  8:25 PM EST ----- Please report results of EMB to patient showing endometrial polyp with cystic atrophy.  No precancer or cancer were seen.  This represents a microscopic polyp as no mass was seen with her pelvic ultrasound at which time the fluid was detected in her endometrial canal. Nothing further needs to be done at this time. Please have her call if she has any vaginal bleeding.

## 2016-12-30 NOTE — Telephone Encounter (Signed)
Left message to call Ezel Vallone at [phone]. 

## 2017-01-06 ENCOUNTER — Telehealth: Payer: Self-pay | Admitting: Physician Assistant

## 2017-01-06 ENCOUNTER — Other Ambulatory Visit (INDEPENDENT_AMBULATORY_CARE_PROVIDER_SITE_OTHER): Payer: Managed Care, Other (non HMO)

## 2017-01-06 DIAGNOSIS — E875 Hyperkalemia: Secondary | ICD-10-CM | POA: Diagnosis not present

## 2017-01-06 LAB — POTASSIUM: POTASSIUM: 4.1 meq/L (ref 3.5–5.1)

## 2017-01-06 NOTE — Telephone Encounter (Signed)
Left message to call again. I have sent messages through the My Chart account also.

## 2017-01-07 ENCOUNTER — Telehealth: Payer: Self-pay | Admitting: *Deleted

## 2017-01-07 DIAGNOSIS — Z86018 Personal history of other benign neoplasm: Secondary | ICD-10-CM

## 2017-01-07 DIAGNOSIS — N83202 Unspecified ovarian cyst, left side: Secondary | ICD-10-CM

## 2017-01-07 NOTE — Telephone Encounter (Signed)
Notes recorded by Burnice Logan, RN on 01/07/2017 at 11:12 AM EST Left message to call Sharee Pimple at [phone]. See telephone encounter dated 01/07/17. ------  Notes recorded by Nunzio Cobbs, MD on 01/07/2017 at 7:57 AM EST Please contact patient regarding CT scan results showing possible left ovarian cyst.  She just had a pelvic US in the office showing a cervical uterine fibroid.  I expect this is what was seen on the CT scan.  I would like for her to have a pelvic ultrasound at the [facility] in 6 weeks to recheck this area.  Please schedule this.   She needs to be seen if she has significant pelvic/abdominal pain. ------

## 2017-01-08 ENCOUNTER — Telehealth: Payer: Self-pay | Admitting: Obstetrics and Gynecology

## 2017-01-08 NOTE — Telephone Encounter (Signed)
Spoke with patient, advised as seen below per Dr. Quincy Simmonds. Patient request afternoon appt. Advised patient I will call to schedule and return call with appt details. Patient is agreeable and verbalizes understanding.

## 2017-01-08 NOTE — Telephone Encounter (Signed)
Spoke with [facility] scheduling. Patient scheduled for PUS Complete and  at [facility] arriving at 2:45pm for 3pm appt on 02/18/17. Arrive with full bladder.   Call returned to patient, left detailed message, ok per current dpr. Advised of appt details as seen above. Return call to office for any additional questions/concerns.  Routing to provider for final review. Patient is agreeable to disposition. Will close encounter.  Cc: Lerry Liner

## 2017-01-08 NOTE — Telephone Encounter (Signed)
Patient is asking to talk with Jill again. No details given. °

## 2017-01-08 NOTE — Telephone Encounter (Signed)
Left message to call Niya Behler at [phone].  

## 2017-01-12 NOTE — Telephone Encounter (Signed)
Call transferred to me by Glorianne Manchester, RN to address questions in regards to benefits. I answered all questions patient presented and directed her to her insurance company customer services number for additional questions for 2019 benefits. Patient had no further questions.    cc: Dr Quincy Simmonds  cc: Glorianne Manchester, RN

## 2017-01-12 NOTE — Telephone Encounter (Signed)
Left message to call Siyah Mault at [phone].  

## 2017-01-12 NOTE — Telephone Encounter (Signed)
Will close encounter

## 2017-01-12 NOTE — Telephone Encounter (Signed)
Spoke with patient. Patient has questions regarding insurance coverage for PUS scheduled at [facility] on 02/18/17.   Call forwarded to [place], insurance and Diplomatic Services operational officer for further assistance.   Forwarding to [place].

## 2017-02-18 ENCOUNTER — Ambulatory Visit (HOSPITAL_COMMUNITY)
Admission: RE | Admit: 2017-02-18 | Discharge: 2017-02-18 | Disposition: A | Discharge status: Home / Self Care | Payer: 59 | Source: Ambulatory Visit | Attending: Obstetrics and Gynecology | Admitting: Obstetrics and Gynecology

## 2017-02-18 DIAGNOSIS — Z86018 Personal history of other benign neoplasm: Secondary | ICD-10-CM | POA: Insufficient documentation

## 2017-02-18 DIAGNOSIS — R1900 Intra-abdominal and pelvic swelling, mass and lump, unspecified site: Secondary | ICD-10-CM | POA: Insufficient documentation

## 2017-02-18 DIAGNOSIS — N83202 Unspecified ovarian cyst, left side: Secondary | ICD-10-CM | POA: Insufficient documentation

## 2017-03-02 ENCOUNTER — Telehealth: Payer: Self-pay

## 2017-03-02 DIAGNOSIS — R1900 Intra-abdominal and pelvic swelling, mass and lump, unspecified site: Secondary | ICD-10-CM

## 2017-03-02 DIAGNOSIS — R1907 Generalized intra-abdominal and pelvic swelling, mass and lump: Secondary | ICD-10-CM

## 2017-03-02 NOTE — Telephone Encounter (Signed)
Left message to call Mycal Conde at [phone]. 

## 2017-03-02 NOTE — Telephone Encounter (Signed)
-----   Message from Salvadore Dom, MD sent at 02/21/2017  2:05 PM EST ----- Please let the patient know that the mass seen on ultrasound was also seen at her November ultrasound, no change in size. It likely represents a fibroid, but given her symptoms and recommendations from Radiology, please set her up for the MRI.

## 2017-03-03 NOTE — Telephone Encounter (Signed)
Spoke with patient. Advised of results and recommendations as seen below. Patient verbalizes understanding and would like to proceed with MRI. MRI pelvis w wo contrast ordered. Spoke with Manus Gunning at [facility] who will contact the patient to schedule.   Left message for the patient letting her know that [facility] will be reaching out to her to schedule her appointment.

## 2017-03-09 ENCOUNTER — Ambulatory Visit
Admission: RE | Admit: 2017-03-09 | Discharge: 2017-03-09 | Disposition: A | Discharge status: Home / Self Care | Payer: 59 | Source: Ambulatory Visit | Attending: Obstetrics and Gynecology | Admitting: Obstetrics and Gynecology

## 2017-03-09 DIAGNOSIS — R1900 Intra-abdominal and pelvic swelling, mass and lump, unspecified site: Secondary | ICD-10-CM

## 2017-03-17 ENCOUNTER — Telehealth: Payer: Self-pay | Admitting: *Deleted

## 2017-03-17 NOTE — Telephone Encounter (Signed)
-----   Message from Nunzio Cobbs, MD sent at 03/16/2017  4:35 PM EST ----- Please inform the patient that the MRI is showing what appears to be a nabothian cyst of the cervix.  There is no sign of malignancy.   We previously have thought this is a cervical fibroid.   No further imaging needed.

## 2017-03-17 NOTE — Telephone Encounter (Signed)
Notes recorded by Burnice Logan, RN on 03/17/2017 at 9:48 AM EST Left message to call Sharee Pimple at [phone].

## 2017-03-18 NOTE — Telephone Encounter (Signed)
Spoke with patient, advised as seen below per Dr. Quincy Simmonds. Patient verbalizes understanding and is agreeable. Will close encounter.

## 2017-04-01 NOTE — Telephone Encounter (Signed)
Notes recorded by Burnice Logan, RN on 03/18/2017 at 3:18 PM EST See telephone encounter dated 03/17/17, results reviewed with patient. ------  Notes recorded by Burnice Logan, RN on 03/17/2017 at 9:48 AM EST Left message to call Sharee Pimple at [phone].  ------  Notes recorded by Nunzio Cobbs, MD on 03/16/2017 at 4:35 PM EST Please inform the patient that the MRI is showing what appears to be a nabothian cyst of the cervix.  There is no sign of malignancy.   We previously have thought this is a cervical fibroid.   No further imaging needed. ------  Notes recorded by Salvadore Dom, MD on 03/10/2017 at 12:45 PM EST Routing to Dr Quincy Simmonds. She is the patient's MD. Patient has not been informed yet  Encounter closed.

## 2017-12-11 NOTE — Progress Notes (Signed)
62 y.o. G0P0 Single Caucasian female here for annual exam.    No vaginal bleeding.   Last year and beginning of this year had multiple studies for LLQ pain.  Had pelvic US 12/18/16 - uterus showed 38 x 21 x 34 mm posterior cervical/LUS fibroid and fluid in endometrial canal.  EMS 3.42 mm. No endometrial mass was noted.  Ovaries were normal.  EMB showed benign endometrial polyp with cystic atrophy.   CT 12/30/16 - showed a liver cyst, right adrenal gland lesion consistent with an adenoma, and a cystic lesion posterior and to the left of the ovary 4.7 cm, thought to be consistent with an ovarian cyst.  Pelvic US repeat 1/919 - 3.4 x 2.0 x 3.4 cm mass of posterior cervix consistent with possible fibroid vs other mass.  Ovaries normal. MRI 02/2917 - 3.5 x 2.4 x 3.7 cm benign mass consistent with possible nabothian cyst of the posterior cervix.   PCP:  Dione Housekeeper MD   Patient's last menstrual period was 02/10/2002.           Sexually active: No.  The current method of family planning is post menopausal status.    Exercising: No.   Smoker:  no  Health Maintenance: Pap: 12-05-16 Neg:Neg HR HPV, 02-17-14 Neg;Neg HR HPV History of abnormal Pap:  no MMG: 12-30-16 Neg/density B/BiRads1.   Colonoscopy: 05-12-12 tubular adenoma;next due 05/2022 BMD:   Over 5 years ago  Result :?normal TDaP:  2014 Gardasil:   no HIV: no Hep C: no Screening Labs: PCP Flu vaccine:  Done.   reports that she has never smoked. She has never used smokeless tobacco. She reports that she does not drink alcohol or use drugs.  Past Medical History:  Diagnosis Date  . Asthma   . Diabetes ([place])   . Exercise-induced asthma    and w/ heat--  per pt no inhaler  . Fibroid of cervix   . History of cardiac murmur as a child   . History of concussion    age 56 w/ LOC--  no residual  . 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 Hypercholesterolemia   . Left ureteral stone   . Wears glasses     Past Surgical History:  Procedure Laterality Date  . COLONOSCOPY N/A 06/02/2012   Procedure: COLONOSCOPY;  Surgeon: Rogene Houston, MD;  [facility];  Service: Endoscopy;  Laterality: N/A;  200-moved to 100 Ann to notify pt  . CYSTOSCOPY WITH RETROGRADE PYELOGRAM, URETEROSCOPY AND STENT PLACEMENT Left 02/07/2016   Procedure: CYSTOSCOPY WITH LEFT  RETROGRADE PYELOGRAM, URETEROSCOPY, POLARIS  STENT PLACEMENT;  Surgeon: Carolan Clines, MD;  Location: [place];  Service: Urology;  Laterality: Left;  . LAPAROSCOPIC APPENDECTOMY N/A 03/21/2012   Procedure: APPENDECTOMY LAPAROSCOPIC;  Surgeon: Merrie Roof, MD;  Location: [place];  Service: General;  Laterality: N/A;  . ORIF RIGHT LOWER LEG FX  2007    Current Outpatient Medications  Medication Sig Dispense Refill  . atorvastatin (LIPITOR) 40 MG tablet Take 1 tablet by mouth daily.    . cholecalciferol (VITAMIN D) 1000 units tablet Take 1,000 Units by mouth daily.    Marland Kitchen  Coenzyme Q10 (CO Q 10) 100 MG CAPS Take 100 mg by mouth daily.    . metFORMIN (GLUCOPHAGE-XR) 500 MG 24 hr tablet Take 500 mg by mouth daily with breakfast.     No current facility-administered medications for this visit.     Family History  Problem Relation Age of Onset  . Heart disease Mother        pacemaker  . Diabetes Mother   . Hypertension Mother   . Heart disease Father   . Hypertension Sister   . Thyroid disease Sister   . Heart disease Sister   . Colon cancer Neg Hx   . Esophageal cancer Neg Hx   . Rectal cancer Neg Hx   . Liver cancer Neg Hx   . Stomach cancer Neg Hx     Review of Systems  All other  systems reviewed and are negative.   Exam:   BP 102/68 (BP Location: Left Arm, Patient Position: Sitting, Cuff Size: Large)   Pulse 64   Resp 16   Ht 5\' 2"  (1.575 m)   Wt 181 lb 9.6 oz (82.4 kg)   LMP 02/10/2002   BMI 33.22 kg/m     General appearance: alert, cooperative and appears stated age Head: Normocephalic, without obvious abnormality, atraumatic Neck: no adenopathy, supple, symmetrical, trachea midline and thyroid normal to inspection and palpation Lungs: clear to auscultation bilaterally Breasts: normal appearance, no masses or tenderness, No nipple retraction or dimpling, No nipple discharge or bleeding, No axillary or supraclavicular adenopathy Heart: regular rate and rhythm Abdomen: soft, non-tender; no masses, no organomegaly Extremities: extremities normal, atraumatic, no cyanosis or edema Skin: Skin color, texture, turgor normal. No rashes or lesions Lymph nodes: Cervical, supraclavicular, and axillary nodes normal. No abnormal inguinal nodes palpated Neurologic: Grossly normal  Pelvic: External genitalia:  no lesions              Urethra:  normal appearing urethra with no masses, tenderness or lesions              Bartholins and Skenes: normal                 Vagina: normal appearing vagina with normal color and discharge, no lesions              Cervix: no lesions              Pap taken: No. Bimanual Exam:  Uterus:  normal size, contour, position, consistency, mobility, non-tender              Adnexa: no mass, fullness, tenderness              Rectal exam: Yes.  .  Confirms.              Anus:  normal sphincter tone, no lesions  Chaperone was present for exam.  Assessment:   Well woman visit with normal exam.  Hx carcinoid tumor of appendix.  Low malignant potential.  Hx tubular adenoma of colon.  LLQ discomfort.   Uncertain etiology.  Hx cervical fibroid versus nabothian cyst.  Hx left ureteral stone.   Plan: Mammogram screening.  She will  schedule. Recommended self breast awareness. Pap and HR HPV as above. Guidelines for Calcium, Vitamin D, regular exercise program including cardiovascular and weight bearing exercise. Call for any vaginal bleeding or increased pelvic discomfort.  BMD next year.  Follow up annually and prn.   After visit summary provided.

## 2017-12-14 ENCOUNTER — Other Ambulatory Visit: Payer: Self-pay

## 2017-12-14 ENCOUNTER — Ambulatory Visit (INDEPENDENT_AMBULATORY_CARE_PROVIDER_SITE_OTHER): Payer: 59 | Admitting: Obstetrics and Gynecology

## 2017-12-14 ENCOUNTER — Encounter: Payer: Self-pay | Admitting: Obstetrics and Gynecology

## 2017-12-14 VITALS — BP 102/68 | HR 64 | Resp 16 | Ht 62.0 in | Wt 181.6 lb

## 2017-12-14 DIAGNOSIS — Z01419 Encounter for gynecological examination (general) (routine) without abnormal findings: Secondary | ICD-10-CM | POA: Diagnosis not present

## 2017-12-14 NOTE — Patient Instructions (Signed)

## 2017-12-16 ENCOUNTER — Other Ambulatory Visit: Payer: Self-pay | Admitting: Obstetrics and Gynecology

## 2017-12-16 DIAGNOSIS — Z1231 Encounter for screening mammogram for malignant neoplasm of breast: Secondary | ICD-10-CM

## 2018-01-25 ENCOUNTER — Ambulatory Visit: Payer: 59

## 2018-02-25 ENCOUNTER — Ambulatory Visit
Admission: RE | Admit: 2018-02-25 | Discharge: 2018-02-25 | Disposition: A | Discharge status: Home / Self Care | Payer: 59 | Source: Ambulatory Visit | Attending: Obstetrics and Gynecology | Admitting: Obstetrics and Gynecology

## 2018-02-25 DIAGNOSIS — Z1231 Encounter for screening mammogram for malignant neoplasm of breast: Secondary | ICD-10-CM

## 2018-10-15 ENCOUNTER — Telehealth: Payer: Self-pay | Admitting: Obstetrics and Gynecology

## 2018-10-15 NOTE — Telephone Encounter (Signed)
Left message on voicemail to call and reschedule cancelled appointment. °

## 2018-12-17 ENCOUNTER — Ambulatory Visit: Payer: 59 | Admitting: Obstetrics and Gynecology

## 2019-03-10 ENCOUNTER — Ambulatory Visit: Payer: 59

## 2019-03-17 ENCOUNTER — Ambulatory Visit: Payer: 59

## 2019-03-21 ENCOUNTER — Ambulatory Visit: Payer: 59

## 2019-06-27 IMAGING — MG DIGITAL SCREENING BILATERAL MAMMOGRAM WITH TOMO AND CAD
8 series · 8 of 24 positions shown · non-contrast
Comparison: Previous exam(s).

CLINICAL DATA: Screening.

EXAM:
DIGITAL SCREENING BILATERAL MAMMOGRAM WITH TOMO AND CAD

[L CC synth-2D]
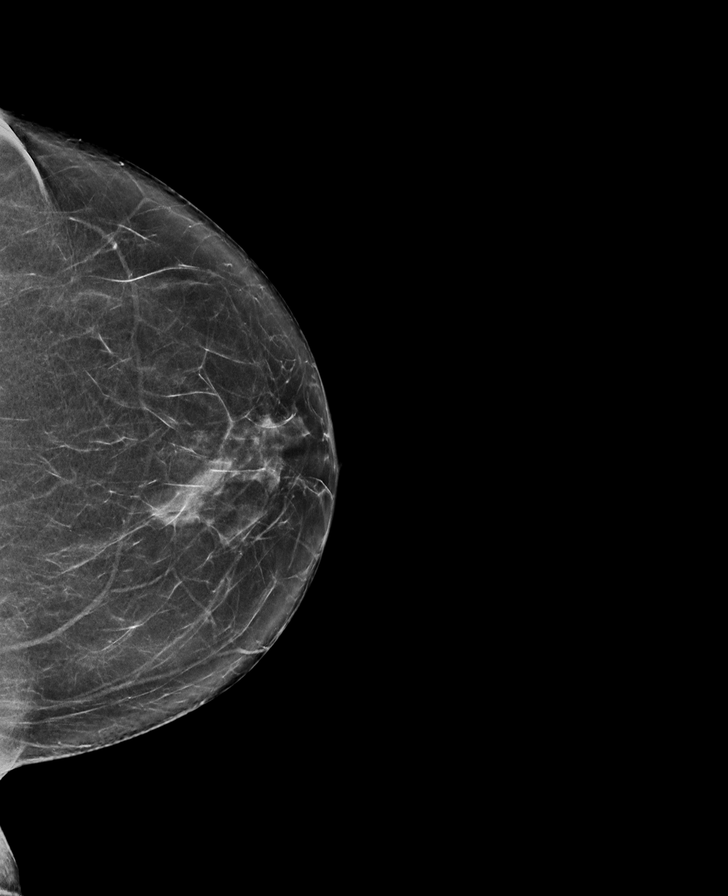

[L MLO synth-2D]
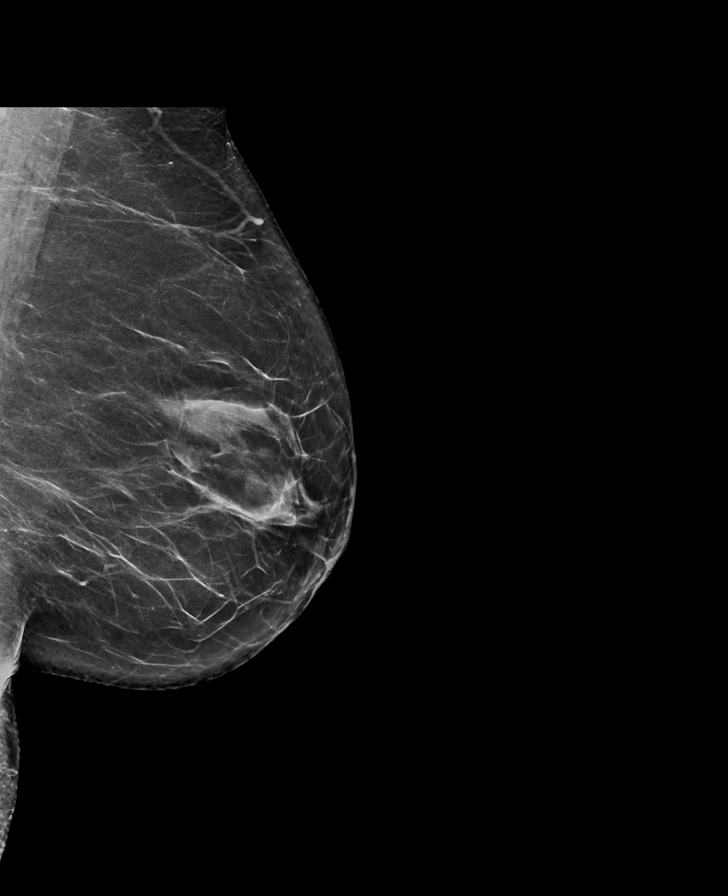

[R MLO synth-2D]
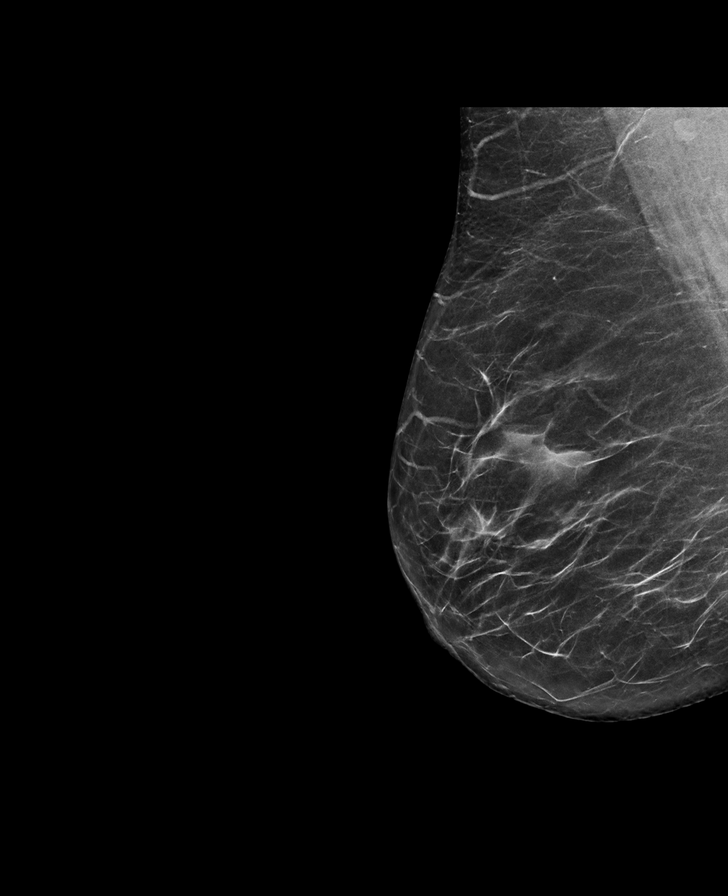

[R CC synth-2D]
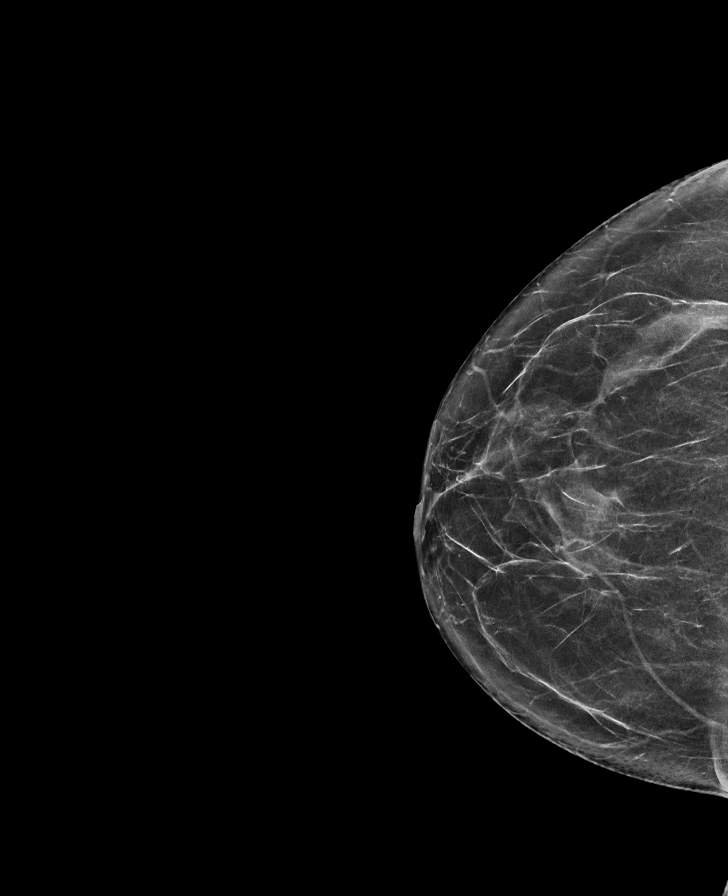

[R CC tomo · tomo slice 33/64.0]
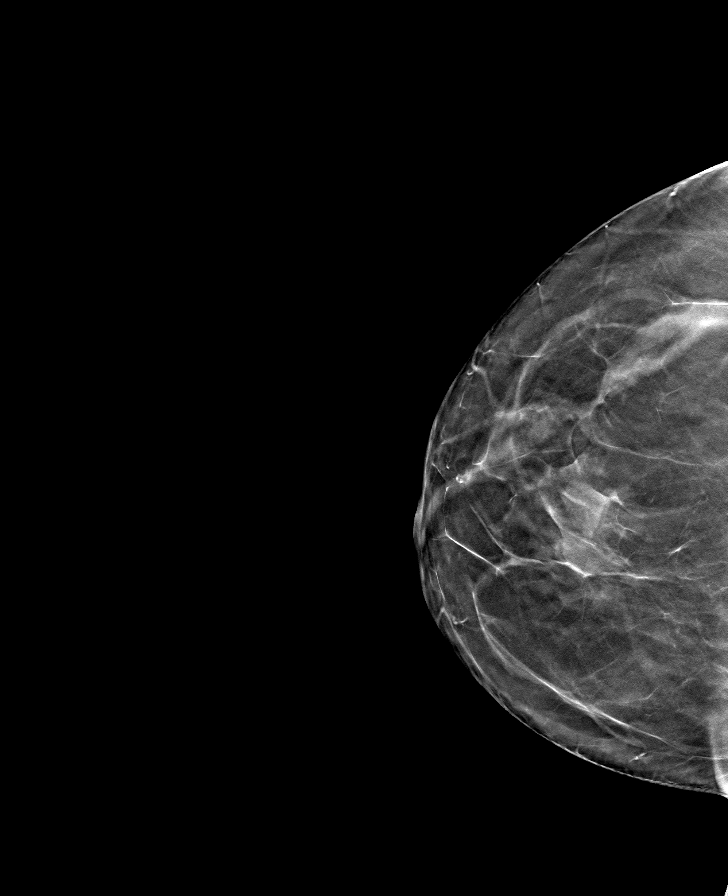

[R MLO tomo · tomo slice 37/72.0]
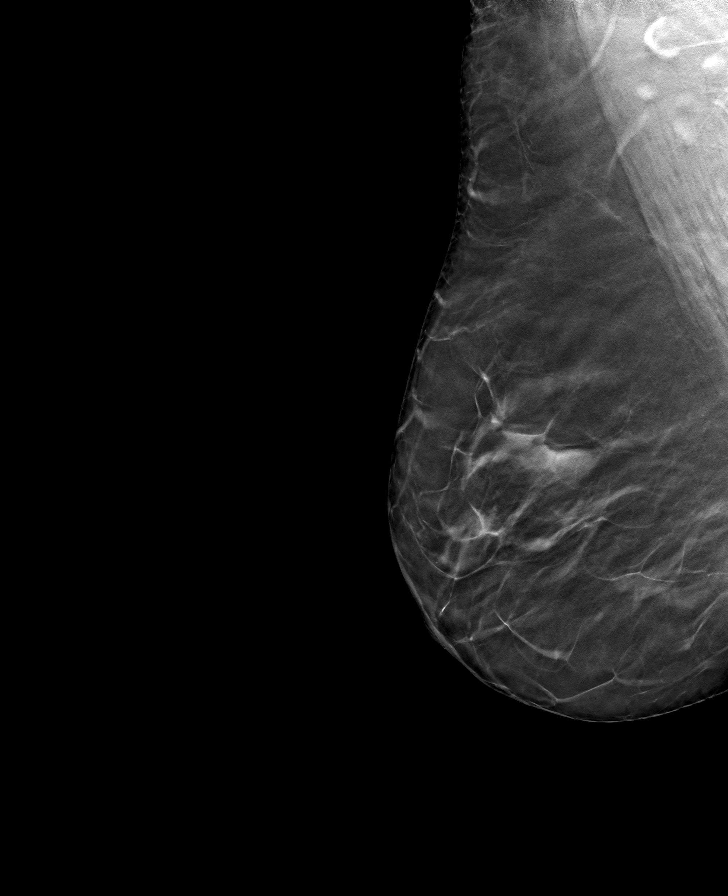

[L CC tomo · tomo slice 36/71.0]
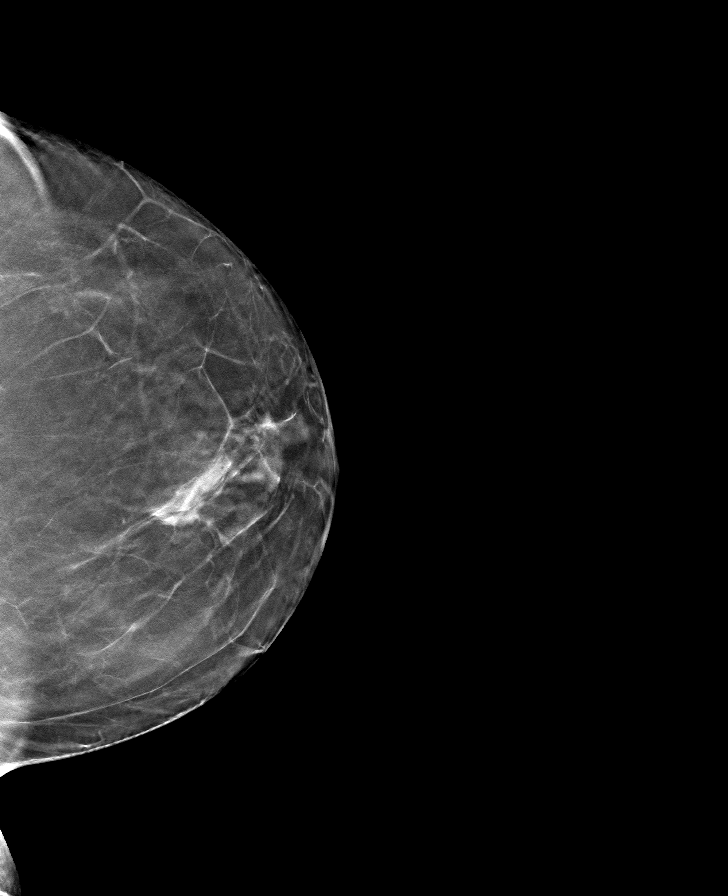

[L MLO tomo · tomo slice 39/76.0]
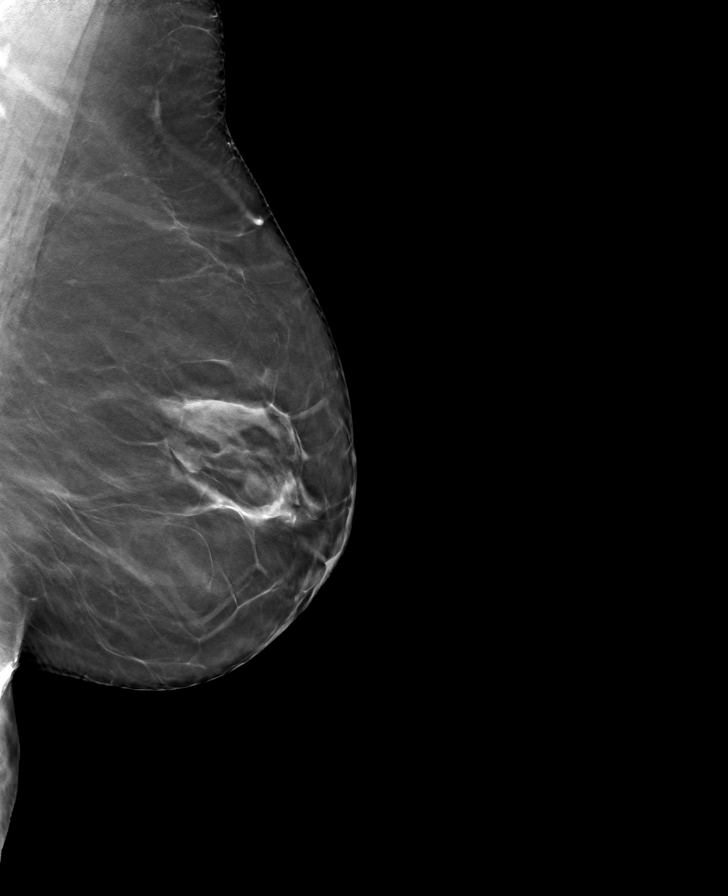

[8 of 24 positions shown; findings below may reference images not displayed]

ACR Breast Density Category b: There are scattered areas of
fibroglandular density.
FINDINGS: There are no findings suspicious for malignancy. Images were
processed with CAD.
IMPRESSION: No mammographic evidence of malignancy. A result letter of this
screening mammogram will be mailed directly to the patient.

RECOMMENDATION:
Screening mammogram in one year. (Code:[id number])

BI-RADS CATEGORY  1: Negative.

## 2022-06-05 ENCOUNTER — Encounter (INDEPENDENT_AMBULATORY_CARE_PROVIDER_SITE_OTHER): Payer: Self-pay | Admitting: *Deleted

## 2022-06-16 ENCOUNTER — Other Ambulatory Visit (HOSPITAL_COMMUNITY): Payer: Self-pay | Admitting: Family Medicine

## 2022-06-16 DIAGNOSIS — Z1231 Encounter for screening mammogram for malignant neoplasm of breast: Secondary | ICD-10-CM

## 2022-06-19 ENCOUNTER — Ambulatory Visit (HOSPITAL_COMMUNITY)
Admission: RE | Admit: 2022-06-19 | Discharge: 2022-06-19 | Disposition: A | Discharge status: Home / Self Care | Payer: Medicare Other | Source: Ambulatory Visit | Attending: Family Medicine | Admitting: Family Medicine

## 2022-06-19 DIAGNOSIS — Z1231 Encounter for screening mammogram for malignant neoplasm of breast: Secondary | ICD-10-CM | POA: Insufficient documentation

## 2022-12-19 ENCOUNTER — Telehealth (INDEPENDENT_AMBULATORY_CARE_PROVIDER_SITE_OTHER): Payer: Self-pay | Admitting: *Deleted

## 2022-12-19 NOTE — Telephone Encounter (Signed)
  Procedure: 10 yr recall colonoscopy  Have you had a colonoscopy before?  2014  Do you have family history of colon cancer?  no  Previous colonoscopy with polyps removed? yes  Do you have a history colorectal cancer?   no  Are you diabetic?  no  Do you have a prosthetic or mechanical heart valve? no  Do you have a pacemaker/defibrillator?   no  Have you had endocarditis/atrial fibrillation?  no  Do you use supplemental oxygen/CPAP?  no  Have you had joint replacement within the last 12 months?  no  Do you tend to be constipated or have to use laxatives?  no   Do you have history of alcohol use? If yes, how much and how often.  no  Do you have history or are you using drugs? If yes, what do are you  using?  no  Have you ever had a stroke/heart attack?  no  Have you ever had a heart or other vascular stent placed,?no  Do you take weight loss medication? no  female patients,: have you had a hysterectomy? no                              are you post menopausal?  yes                              do you still have your menstrual cycle? no    Date of last menstrual period?   Do you take any blood-thinning medications such as: (Plavix, aspirin, Coumadin, Aggrenox, Brilinta, Xarelto, Eliquis, Pradaxa, Savaysa or Effient)? no  If yes we need the name, milligram, dosage and who is prescribing doctor:               Current Outpatient Medications  Medication Sig Dispense Refill   alendronate (FOSAMAX) 70 MG tablet Take 70 mg by mouth once a week. Take with a full glass of water on an empty stomach.     atorvastatin (LIPITOR) 40 MG tablet Take 1 tablet by mouth daily.     Calcium Carbonate (CALCIUM 600 PO) Take by mouth daily.     Cholecalciferol (VITAMIN D-3) 25 MCG (1000 UT) CAPS Take by mouth daily.     Coenzyme Q10 (CO Q 10) 100 MG CAPS Take 100 mg by mouth daily.     metFORMIN (GLUCOPHAGE-XR) 500 MG 24 hr tablet Take 500 mg by mouth daily with breakfast.     Multiple  Vitamin (MULTIVITAMIN) tablet Take 1 tablet by mouth daily.     No current facility-administered medications for this visit.    No Known Allergies  [facility]

## 2022-12-23 MED ORDER — PEG 3350-KCL-NA BICARB-NACL 420 G PO SOLR: 4000.0000 mL | Freq: Once | ORAL | 0 refills | Status: DC

## 2022-12-23 NOTE — Addendum Note (Signed)
Addended by: Armstead Peaks on: 12/23/2022 11:52 AM   Modules accepted: Orders

## 2022-12-23 NOTE — Telephone Encounter (Signed)
CALLED PT. Scheduled for 12/18. RX will be sent to pharmacy. Instructions mailed.

## 2022-12-23 NOTE — Telephone Encounter (Signed)
Questionnaire from recall, no referral needed  

## 2023-01-26 ENCOUNTER — Telehealth (INDEPENDENT_AMBULATORY_CARE_PROVIDER_SITE_OTHER): Payer: Self-pay | Admitting: Gastroenterology

## 2023-01-26 ENCOUNTER — Other Ambulatory Visit (INDEPENDENT_AMBULATORY_CARE_PROVIDER_SITE_OTHER): Payer: Self-pay

## 2023-01-26 MED ORDER — PEG 3350-KCL-NA BICARB-NACL 420 G PO SOLR: 4000.0000 mL | Freq: Once | ORAL | 0 refills | Status: AC

## 2023-01-26 NOTE — Telephone Encounter (Signed)
Pt left voicemail stating she was going over her instructions and is suppose to start drinking some liquid on Tuesday but does not have any liquid. Returned call to patient and advised that prep was sent in but it may have been put back by pharmacy. Resent prep to [facility]

## 2023-01-28 ENCOUNTER — Encounter (HOSPITAL_COMMUNITY): Payer: Self-pay

## 2023-01-28 ENCOUNTER — Encounter (HOSPITAL_COMMUNITY)
Admission: RE | Disposition: A | Discharge status: Home / Self Care | Payer: Self-pay | Source: Home / Self Care | Attending: Gastroenterology

## 2023-01-28 ENCOUNTER — Ambulatory Visit (HOSPITAL_COMMUNITY): Payer: Medicare Other | Admitting: Anesthesiology

## 2023-01-28 ENCOUNTER — Ambulatory Visit (HOSPITAL_COMMUNITY)
Admission: RE | Admit: 2023-01-28 | Discharge: 2023-01-28 | Disposition: A | Discharge status: Home / Self Care | Payer: Medicare Other | Attending: Gastroenterology | Admitting: Gastroenterology

## 2023-01-28 ENCOUNTER — Other Ambulatory Visit: Payer: Self-pay

## 2023-01-28 DIAGNOSIS — Z860101 Personal history of adenomatous and serrated colon polyps: Secondary | ICD-10-CM | POA: Diagnosis not present

## 2023-01-28 DIAGNOSIS — K648 Other hemorrhoids: Secondary | ICD-10-CM

## 2023-01-28 DIAGNOSIS — K644 Residual hemorrhoidal skin tags: Secondary | ICD-10-CM

## 2023-01-28 DIAGNOSIS — E119 Type 2 diabetes mellitus without complications: Secondary | ICD-10-CM | POA: Insufficient documentation

## 2023-01-28 DIAGNOSIS — Z09 Encounter for follow-up examination after completed treatment for conditions other than malignant neoplasm: Secondary | ICD-10-CM | POA: Insufficient documentation

## 2023-01-28 DIAGNOSIS — K635 Polyp of colon: Secondary | ICD-10-CM | POA: Diagnosis not present

## 2023-01-28 DIAGNOSIS — Z1211 Encounter for screening for malignant neoplasm of colon: Secondary | ICD-10-CM

## 2023-01-28 DIAGNOSIS — I10 Essential (primary) hypertension: Secondary | ICD-10-CM | POA: Insufficient documentation

## 2023-01-28 DIAGNOSIS — Z7984 Long term (current) use of oral hypoglycemic drugs: Secondary | ICD-10-CM | POA: Insufficient documentation

## 2023-01-28 HISTORY — PX: POLYPECTOMY: SHX149

## 2023-01-28 HISTORY — PX: COLONOSCOPY WITH PROPOFOL: SHX5780

## 2023-01-28 LAB — HM COLONOSCOPY

## 2023-01-28 LAB — GLUCOSE, CAPILLARY: Glucose-Capillary: 107 mg/dL — ABNORMAL HIGH (ref 70–99)

## 2023-01-28 SURGERY — COLONOSCOPY WITH PROPOFOL
Anesthesia: General

## 2023-01-28 MED ORDER — PROPOFOL 500 MG/50ML IV EMUL
INTRAVENOUS | Status: DC | PRN
  Administered 2023-01-28: 150 ug/kg/min via INTRAVENOUS

## 2023-01-28 MED ORDER — STERILE WATER FOR IRRIGATION IR SOLN
Status: DC | PRN
  Administered 2023-01-28: 60 mL

## 2023-01-28 MED ORDER — PROPOFOL 500 MG/50ML IV EMUL
INTRAVENOUS | Status: AC
  Filled 2023-01-28: qty 50

## 2023-01-28 MED ORDER — PROPOFOL 1000 MG/100ML IV EMUL
INTRAVENOUS | Status: AC
  Filled 2023-01-28: qty 300

## 2023-01-28 MED ORDER — LIDOCAINE HCL (CARDIAC) PF 100 MG/5ML IV SOSY
PREFILLED_SYRINGE | INTRAVENOUS | Status: DC | PRN
  Administered 2023-01-28: 60 mg via INTRATRACHEAL

## 2023-01-28 MED ORDER — LIDOCAINE HCL (PF) 2 % IJ SOLN
INTRAMUSCULAR | Status: AC
  Filled 2023-01-28: qty 20

## 2023-01-28 MED ORDER — PHENYLEPHRINE HCL (PRESSORS) 10 MG/ML IV SOLN
INTRAVENOUS | Status: DC | PRN
  Administered 2023-01-28 (×3): 160 ug via INTRAVENOUS

## 2023-01-28 MED ORDER — LACTATED RINGERS IV SOLN: INTRAVENOUS | Status: DC | PRN

## 2023-01-28 MED ORDER — LACTATED RINGERS IV SOLN: INTRAVENOUS | Status: DC

## 2023-01-28 MED ORDER — PROPOFOL 10 MG/ML IV BOLUS
INTRAVENOUS | Status: DC | PRN
  Administered 2023-01-28: 75 mg via INTRAVENOUS

## 2023-01-28 MED ORDER — PHENYLEPHRINE 80 MCG/ML (10ML) SYRINGE FOR IV PUSH (FOR BLOOD PRESSURE SUPPORT)
PREFILLED_SYRINGE | INTRAVENOUS | Status: AC
  Filled 2023-01-28: qty 10

## 2023-01-28 NOTE — Op Note (Signed)
[facility] Patient Name: Tara Glover Procedure Date: 01/28/2023 10:16 AM MRN: [id number] Date of Birth: Jan 19, 1956 Attending MD: Sanjuan Dame , MD, [id number] CSN: [id number] Age: 67 Admit Type: Outpatient Procedure:                Colonoscopy Indications:              Screening for colorectal malignant neoplasm Providers:                Sanjuan Dame, MD, Crystal Page, Dyann Ruddle Referring MD:             Sanjuan Dame, MD Medicines:                Monitored Anesthesia Care Complications:            No immediate complications. Estimated Blood Loss:     Estimated blood loss: none. Procedure:                Pre-Anesthesia Assessment:                           - Prior to the procedure, a History and Physical                            was performed, and patient medications and                            allergies were reviewed. The patient's tolerance of                            previous anesthesia was also reviewed. The risks                            and benefits of the procedure and the sedation                            options and risks were discussed with the patient.                            All questions were answered, and informed consent                            was obtained. Prior Anticoagulants: The patient has                            taken no anticoagulant or antiplatelet agents. ASA                            Grade Assessment: II - A patient with mild systemic                            disease. After reviewing the risks and benefits,                            the patient was deemed in satisfactory condition to  undergo the procedure.                           After obtaining informed consent, the colonoscope                            was passed under direct vision. Throughout the                            procedure, the patient's blood pressure, pulse, and                            oxygen saturations were monitored  continuously. The                            [id number]) scope was introduced through                            the anus and advanced to the the terminal ileum.                            The colonoscopy was performed without difficulty.                            The patient tolerated the procedure well. The                            quality of the bowel preparation was evaluated                            using the BBPS [facility] Bowel Preparation Scale)                            with scores of: Right Colon = 3, Transverse Colon =                            3 and Left Colon = 3 (entire mucosa seen well with                            no residual staining, small fragments of stool or                            opaque liquid). The total BBPS score equals 9. The                            terminal ileum, ileocecal valve, appendiceal                            orifice, and rectum were photographed. Scope In: 10:24:52 AM Scope Out: 10:44:52 AM Scope Withdrawal Time: 0 hours 17 minutes 18 seconds  Total Procedure Duration: 0 hours 20 minutes 0 seconds  Findings:      The perianal and digital rectal examinations were normal.      A 3 mm  polyp was found in the sigmoid colon. The polyp was sessile. The       polyp was removed with a cold snare. Resection and retrieval were       complete.      Non-bleeding external and internal hemorrhoids were found during       retroflexion. The hemorrhoids were small.      The terminal ileum appeared normal. Impression:               - One 3 mm polyp in the sigmoid colon, removed with                            a cold snare. Resected and retrieved.                           - Non-bleeding external and internal hemorrhoids.                           - The examined portion of the ileum was normal. Moderate Sedation:      Per Anesthesia Care Recommendation:           - Patient has a contact number available for                             emergencies. The signs and symptoms of potential                            delayed complications were discussed with the                            patient. Return to normal activities tomorrow.                            Written discharge instructions were provided to the                            patient.                           - Resume previous diet.                           - Continue present medications.                           - Await pathology results.                           - Repeat colonoscopy in 7-10 years for screening                            purposes.                           - Return to primary care physician as previously  scheduled. Procedure Code(s):        --- Professional ---                           [phone], Colonoscopy, flexible; with removal of                            tumor(s), polyp(s), or other lesion(s) by snare                            technique Diagnosis Code(s):        --- Professional ---                           Z12.11, Encounter for screening for malignant                            neoplasm of colon                           D12.5, Benign neoplasm of sigmoid colon                           K64.8, Other hemorrhoids CPT copyright 2022 American Medical Association. All rights reserved. The codes documented in this report are preliminary and upon coder review may  be revised to meet current compliance requirements. Sanjuan Dame, MD Sanjuan Dame, MD 01/28/2023 10:49:55 AM This report has been signed electronically. Number of Addenda: 0

## 2023-01-28 NOTE — H&P (Signed)
Primary Care Physician:  Rebecka Apley, NP Primary Gastroenterologist:  Dr. Tasia Catchings  Pre-Procedure History & Physical: HPI:  Tara Glover is a 67 y.o. female is here for a colonoscopy for colon cancer screening purposes.   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 history of tubular adenomatous colon polyp at colonoscopy 2014-was advised 10 year interval follow-up  Patient denies any family history of colorectal cancer.  No melena or hematochezia.  No abdominal pain or unintentional weight loss.  No change in bowel habits.  Overall feels well from a GI standpoint  2014  Findings:   Prep excellent. Mucosa of terminal ileum. No abnormality noted to cecal mucosa or extrinsic impression. 4 mm polyp cold snared from rectosigmoid junction. Small hemorrhoids below the dentate line in small anal papillae.  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, pNx, Mx)  well diferentiated tubular type w/ no goblet cell component,some focal visceral surface involvement and lymphovascular invasion---  did not need any further therapy or follow up   Hypercholesterolemia    Left ureteral stone    Wears glasses     Past Surgical History:  Procedure Laterality Date   COLONOSCOPY N/A 06/02/2012   Procedure: COLONOSCOPY;  Surgeon: Malissa Hippo, MD;  [facility];  Service: Endoscopy;  Laterality: N/A;  200-moved to 100 Ann to notify pt   CYSTOSCOPY WITH RETROGRADE PYELOGRAM, URETEROSCOPY AND STENT PLACEMENT Left 02/07/2016   Procedure: CYSTOSCOPY WITH LEFT  RETROGRADE  PYELOGRAM, URETEROSCOPY, POLARIS  STENT PLACEMENT;  Surgeon: Jethro Bolus, MD;  Location: [facility] [place];  Service: Urology;  Laterality: Left;   LAPAROSCOPIC APPENDECTOMY N/A 03/21/2012   Procedure: APPENDECTOMY LAPAROSCOPIC;  Surgeon: Robyne Askew, MD;  Location[facility];  Service: General;  Laterality: N/A;   ORIF RIGHT LOWER LEG FX  2007    Prior to Admission medications   Medication Sig Start Date End Date Taking? Authorizing Provider  alendronate (FOSAMAX) 70 MG tablet Take 70 mg by mouth once a week. Take with a full glass of water on an empty stomach.   Yes [provider]  atorvastatin (LIPITOR) 40 MG tablet Take 1 tablet by mouth daily. 10/28/16  Yes [provider]  Calcium Carbonate (CALCIUM 600 PO) Take by mouth daily.   Yes [provider]  Cholecalciferol (VITAMIN D-3) 25 MCG (1000 UT) CAPS Take by mouth daily.   Yes [provider]  Coenzyme Q10 (CO Q 10) 100 MG CAPS Take 100 mg by mouth daily.   Yes [provider]  metFORMIN (GLUCOPHAGE-XR) 500 MG 24 hr tablet Take 500 mg by mouth daily with breakfast.   Yes [provider]  Multiple Vitamin (MULTIVITAMIN) tablet Take 1 tablet by mouth daily.   Yes [provider]    Allergies as of 12/23/2022   (No Known Allergies)    Family History  Problem Relation Age of Onset   Heart disease Mother        pacemaker  Diabetes Mother    Hypertension Mother    Heart disease Father    Hypertension Sister    Thyroid disease Sister    Heart disease Sister    Colon cancer Neg Hx    Esophageal cancer Neg Hx    Rectal cancer Neg Hx    Liver cancer Neg Hx    Stomach cancer Neg Hx     Social History   Socioeconomic History   Marital status: Single    Spouse name: Not on file   Number of children: 0   Years of education: Not on file   Highest education level: Not on file  Occupational History   Occupation: Energy manager  Tobacco Use   Smoking  status: Never   Smokeless tobacco: Never  Vaping Use   Vaping status: Never Used  Substance and Sexual Activity   Alcohol use: No   Drug use: No   Sexual activity: Not Currently    Birth control/protection: Post-menopausal  Other Topics Concern   Not on file  Social History Narrative   Not on file   Social Drivers of Health   Financial Resource Strain: Low Risk  (11/20/2022)   Received from [facility]   Overall Financial Resource Strain (CARDIA)    Difficulty of Paying Living Expenses: Not hard at all  Food Insecurity: No Food Insecurity (11/20/2022)   Received from [facility]   Hunger Vital Sign    Worried About Running Out of Food in the Last Year: Never true    Ran Out of Food in the Last Year: Never true  Transportation Needs: No Transportation Needs (11/20/2022)   Received from [facility] - Transportation    Lack of Transportation (Medical): No    Lack of Transportation (Non-Medical): No  Physical Activity: Unknown (11/20/2022)   Received from [facility]   Exercise Vital Sign    Days of Exercise per Week: 0 days    Minutes of Exercise per Session: Not on file  Stress: No Stress Concern Present (11/20/2022)   Received from [facility] - Occupational Stress Questionnaire    Feeling of Stress : Not at all  Social Connections: Socially Integrated (11/20/2022)   Received from [facility]   Social Network    How would you rate your social network (family, work, friends)?: Good participation with social networks  Intimate Partner Violence: Not At Risk (11/20/2022)   Received from [facility]   HITS    Over the last 12 months how often did your partner physically hurt you?: Never    Over the last 12 months how often did your partner insult you or talk down to you?: Never    Over the last 12 months how often did your partner threaten you with physical harm?: Never    Over the last 12 months how often  did your partner scream or curse at you?: Never    Review of Systems: See HPI, otherwise negative ROS  Physical Exam: Vital signs in last 24 hours: Temp:  [98.6 F (37 C)] 98.6 F (37 C) (12/18 0927) Pulse Rate:  [68] 68 (12/18 0927) Resp:  [13] 13 (12/18 0927) BP: (143)/(64) 143/64 (12/18 0927) SpO2:  [98 %] 98 % (12/18 0927) Weight:  [86.2 kg] 86.2 kg (12/18 0927)   General:   Alert,  Well-developed, well-nourished, pleasant and cooperative in NAD Head:  Normocephalic and atraumatic. Eyes:  Sclera clear, no icterus.   Conjunctiva pink.  Ears:  Normal auditory acuity. Nose:  No deformity, discharge,  or lesions. Msk:  Symmetrical without gross deformities. Normal posture. Extremities:  Without clubbing or edema. Neurologic:  Alert and  oriented x4;  grossly normal neurologically. Skin:  Intact without significant lesions or rashes. Psych:  Alert and cooperative. Normal mood and affect.  Impression/Plan: Tara Glover is here for a colonoscopy to be performed for colon cancer screening purposes.  The risks of the procedure including infection, bleed, or perforation as well as benefits, limitations, alternatives and imponderables have been reviewed with the patient. Questions have been answered. All parties agreeable.

## 2023-01-28 NOTE — Anesthesia Preprocedure Evaluation (Signed)
Anesthesia Evaluation  Patient identified by MRN, date of birth, ID band Patient awake    Reviewed: Allergy & Precautions, H&P , NPO status , Patient's Chart, lab work & pertinent test results, reviewed documented beta blocker date and time   Airway Mallampati: II  TM Distance: >3 FB Neck ROM: full    Dental no notable dental hx.    Pulmonary neg pulmonary ROS, asthma    Pulmonary exam normal breath sounds clear to auscultation       Cardiovascular Exercise Tolerance: Good hypertension, negative cardio ROS  Rhythm:regular Rate:Normal     Neuro/Psych negative neurological ROS  negative psych ROS   GI/Hepatic negative GI ROS, Neg liver ROS,,,  Endo/Other  negative endocrine ROSdiabetes    Renal/GU negative Renal ROS  negative genitourinary   Musculoskeletal   Abdominal   Peds  Hematology negative hematology ROS (+)   Anesthesia Other Findings   Reproductive/Obstetrics negative OB ROS                             Anesthesia Physical Anesthesia Plan  ASA: 2  Anesthesia Plan: General   Post-op Pain Management:    Induction:   PONV Risk Score and Plan: Propofol infusion  Airway Management Planned:   Additional Equipment:   Intra-op Plan:   Post-operative Plan:   Informed Consent: I have reviewed the patients History and Physical, chart, labs and discussed the procedure including the risks, benefits and alternatives for the proposed anesthesia with the patient or authorized representative who has indicated his/her understanding and acceptance.     Dental Advisory Given  Plan Discussed with: CRNA  Anesthesia Plan Comments:        Anesthesia Quick Evaluation

## 2023-01-28 NOTE — Discharge Instructions (Signed)

## 2023-01-28 NOTE — Transfer of Care (Signed)
Immediate Anesthesia Transfer of Care Note  Patient: Tara Glover  Procedure(s) Performed: COLONOSCOPY WITH PROPOFOL POLYPECTOMY INTESTINAL  Patient Location: Endoscopy Unit  Anesthesia Type:General  Level of Consciousness: awake, alert , and oriented  Airway & Oxygen Therapy: Patient Spontanous Breathing  Post-op Assessment: Report given to RN and Post -op Vital signs reviewed and stable  Post vital signs: Reviewed and stable  Last Vitals:  Vitals Value Taken Time  BP 120/60   Temp 98   Pulse 61   Resp 16   SpO2 97     Last Pain:  Vitals:   01/28/23 1021  TempSrc:   PainSc: 0-No pain      Patients Stated Pain Goal: 4 (01/28/23 0927)  Complications: No notable events documented.

## 2023-01-29 ENCOUNTER — Encounter (INDEPENDENT_AMBULATORY_CARE_PROVIDER_SITE_OTHER): Payer: Self-pay | Admitting: *Deleted

## 2023-01-29 LAB — SURGICAL PATHOLOGY

## 2023-01-30 NOTE — Anesthesia Postprocedure Evaluation (Signed)
Anesthesia Post Note  Patient: Graciemae Carothers  Procedure(s) Performed: COLONOSCOPY WITH PROPOFOL POLYPECTOMY INTESTINAL  Patient location during evaluation: Phase II Anesthesia Type: General Level of consciousness: awake Pain management: pain level controlled Vital Signs Assessment: post-procedure vital signs reviewed and stable Respiratory status: spontaneous breathing and respiratory function stable Cardiovascular status: blood pressure returned to baseline and stable Postop Assessment: no headache and no apparent nausea or vomiting Anesthetic complications: no Comments: Late entry   No notable events documented.   Last Vitals:  Vitals:   01/28/23 0927 01/28/23 1048  BP: (!) 143/64 (!) 111/57  Pulse: 68 62  Resp: 13 (!) 22  Temp: 37 C 36.4 C  SpO2: 98% 98%    Last Pain:  Vitals:   01/28/23 1048  TempSrc: Oral  PainSc: 0-No pain                 Windell Norfolk

## 2023-02-02 ENCOUNTER — Encounter (INDEPENDENT_AMBULATORY_CARE_PROVIDER_SITE_OTHER): Payer: Self-pay | Admitting: *Deleted

## 2023-02-02 NOTE — Progress Notes (Signed)
I reviewed the pathology results. Ann, can you send her a letter with the findings as described below please? Repeat colonoscopy in 10 years  Thanks,  Vista Lawman, MD Gastroenterology and Hepatology [facility]  ---------------------------------------------------------------------------------------------  [facility] [address], [place], [place] [address] Phone:  [phone]   02/02/23 [place], [place]   Dear Tara Glover,  I am writing to inform you that the biopsies taken during your recent endoscopic examination showed:  I am writing to let you know the results of your recent colonoscopy.  You had a total of 1 polyps removed. The pathology came back as "hyperplastic polyp." These findings are NOT cancer and do not turn into cancer.  Given these findings, it is recommended that your next colonoscopy be performed in 10 years.  Also I value your feedback , so if you get a survey , please take the time to fill it out and thank you for choosing [facility]  Please call us at [phone] if you have persistent problems or have questions about your condition that have not been fully answered at this time.  Sincerely,  Vista Lawman, MD Gastroenterology and Hepatology

## 2023-02-19 ENCOUNTER — Encounter (HOSPITAL_COMMUNITY): Payer: Self-pay | Admitting: Gastroenterology

## 2023-04-13 ENCOUNTER — Encounter (HOSPITAL_COMMUNITY): Payer: Self-pay

## 2023-04-13 ENCOUNTER — Other Ambulatory Visit: Payer: Self-pay

## 2023-04-13 ENCOUNTER — Emergency Department (HOSPITAL_COMMUNITY)

## 2023-04-13 ENCOUNTER — Emergency Department (HOSPITAL_COMMUNITY)
Admission: EM | Admit: 2023-04-13 | Discharge: 2023-04-13 | Disposition: A | Discharge status: Home / Self Care | Attending: Emergency Medicine | Admitting: Emergency Medicine

## 2023-04-13 DIAGNOSIS — I10 Essential (primary) hypertension: Secondary | ICD-10-CM | POA: Diagnosis not present

## 2023-04-13 DIAGNOSIS — Z7984 Long term (current) use of oral hypoglycemic drugs: Secondary | ICD-10-CM | POA: Insufficient documentation

## 2023-04-13 DIAGNOSIS — H5789 Other specified disorders of eye and adnexa: Secondary | ICD-10-CM | POA: Insufficient documentation

## 2023-04-13 DIAGNOSIS — E119 Type 2 diabetes mellitus without complications: Secondary | ICD-10-CM | POA: Diagnosis not present

## 2023-04-13 DIAGNOSIS — H532 Diplopia: Secondary | ICD-10-CM | POA: Insufficient documentation

## 2023-04-13 DIAGNOSIS — Z859 Personal history of malignant neoplasm, unspecified: Secondary | ICD-10-CM | POA: Diagnosis not present

## 2023-04-13 DIAGNOSIS — H579 Unspecified disorder of eye and adnexa: Secondary | ICD-10-CM

## 2023-04-13 LAB — CBC
HCT: 40.9 % (ref 36.0–46.0)
Hemoglobin: 13.4 g/dL (ref 12.0–15.0)
MCH: 29 pg (ref 26.0–34.0)
MCHC: 32.8 g/dL (ref 30.0–36.0)
MCV: 88.5 fL (ref 80.0–100.0)
Platelets: 264 10*3/uL (ref 150–400)
RBC: 4.62 MIL/uL (ref 3.87–5.11)
RDW: 13.4 % (ref 11.5–15.5)
WBC: 8.6 10*3/uL (ref 4.0–10.5)
nRBC: 0 % (ref 0.0–0.2)

## 2023-04-13 LAB — COMPREHENSIVE METABOLIC PANEL
ALT: 25 U/L (ref 0–44)
AST: 22 U/L (ref 15–41)
Albumin: 4 g/dL (ref 3.5–5.0)
Alkaline Phosphatase: 56 U/L (ref 38–126)
Anion gap: 12 (ref 5–15)
BUN: 18 mg/dL (ref 8–23)
CO2: 24 mmol/L (ref 22–32)
Calcium: 9.4 mg/dL (ref 8.9–10.3)
Chloride: 106 mmol/L (ref 98–111)
Creatinine, Ser: 0.86 mg/dL (ref 0.44–1.00)
GFR, Estimated: >60 mL/min (ref >60)
Glucose, Bld: 129 mg/dL — ABNORMAL HIGH (ref 70–99)
Potassium: 5.4 mmol/L — ABNORMAL HIGH (ref 3.5–5.1)
Sodium: 142 mmol/L (ref 135–145)
Total Bilirubin: 0.7 mg/dL (ref 0.0–1.2)
Total Protein: 7.2 g/dL (ref 6.5–8.1)

## 2023-04-13 MED ORDER — GADOBUTROL 1 MMOL/ML IV SOLN
8.0000 mL | Freq: Once | INTRAVENOUS | Status: AC | PRN
  Administered 2023-04-13: 8 mL via INTRAVENOUS

## 2023-04-13 NOTE — Progress Notes (Signed)
 Patient with visual disturbance and right orbital swelling with some diplopia.  Workup demonstrates evidence of a large mobile mass without intracranial extension.  Recommend ophthalmologic care for this lesion.  This is unlikely to be done here locally and patient should probably follow-up with ophthalmology at [facility].  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  No indication for treatment at this point as there is no mass effect.  I think this is unlikely to be unrelated to her orbital mass.  She should follow-up with my office over the next few weeks.  We will watch this frontal lesion over time.

## 2023-04-13 NOTE — Discharge Instructions (Addendum)
 Tara Glover:  Thank you for allowing [place] to take care of you today.  We hope you begin feeling better soon.  To-Do: Please follow up with your eye doctor to arrange continued workup for your eye mass. Please follow-up with neurosurgery.  Please call their office directly at [phone]  to schedule an appointment. Please return to the Emergency Department or call 911 if you experience chest pain, shortness of breath, severe pain, severe fever, altered mental status, or have any reason to think that you need emergency medical care.  Thank you again.  Hope you feel better soon.  Department of Emergency Medicine [place]   MRI radiology reads:  IMPRESSION:  MRI orbits:    1. 4.5 x 2.2 x 3.3 cm enhancing mass centered within the  superolateral aspect of the right orbit, with  displacement/involvement of orbital structures, as described.  Differential considerations include lymphoma, IgG-4 related disease,  idiopathic orbital inflammation (pseudotumor), lacrimal gland tumor,  orbital metastasis and atypical appearance of an orbital vascular  malformation, among others. Direct tissue sampling should be  considered. Associated right proptosis.  2. Right maxillary sinus disease as described.  3. Nonspecific enlarged right parotid lymph node (measuring 12 mm).    MRI brain:    1. 6 mm enhancing dural-based mass overlying the left frontal lobe.  An additional 3 mm dural-based mass is questioned along the  undersurface of the left tentorium. The 6 mm mass is favored to  reflect a meningioma. The questioned 3 mm mass is favored to reflect  a meningioma or a prominent tentorial vein. However given the right  orbital process described above, a short-interval follow-up brain  MRI (with and without contrast) is recommended in 3 months to ensure  stability and to exclude alternative etiologies (such as small dural  metastases).  2. Partially empty sella turcica. This finding can  reflect  incidental anatomic variation, or alternatively, it can be  associated with chronic idiopathic intracranial hypertension  (pseudotumor cerebri).  3. Otherwise unremarkable MRI appearance of the brain.

## 2023-04-13 NOTE — ED Provider Notes (Signed)
 Assume Care Medical Decision Making  Patient care of assumed from previous provider at change of shift. Please see the original provider note from this emergency department encounter for full history and physical.   Briefly, Tara Glover is a 68 y.o. female who presents with:  Clinical Course as of 04/13/23 Jeanne Ivan Apr 13, 2023  1528 Sent here from ophtho for diminished ROM of R eye going on since November. Optho concerned for cavernous sinus pathology [ ]  f/u MRI [ ]  consider neuro consult [RK]    Clinical Course User Index [RK] Caron Presume, MD     Please refer to the original provider's note for additional information regarding the care of Blinda Turek.  Labs reviewed by myself and considered in medical decision making.  Imaging reviewed by myself and considered in medical decision making. Imaging final read interpreted by radiology.  Additional MDM/ED Course: Briefly this is a 68 year old patient presenting from ophthalmology for diminished range of motion of the right eye that has been going on since November. She had a dilated funduscopic exam at her ophthalmology appointment and was sent here by her ophthalmologist due to concern for possible cavernous sinus pathology.  Initially was seen for the symptoms back in November and was treated for conjunctivitis as well as zoster ophthalmicus without improvement in symptoms. She has been getting treated with valacyclovir and doxycycline as well as prednisone that worked for a while, however symptoms came back.  Today at her neurology appointment, she had normal slit-lamp examination. She is noted to have a total internal ophthalmoplegia of right eye, with loss of abduction adduction elevation and depression with his V1 numbness.  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 Neurosurgery was consulted, they recommended outpatient workup with our neurosurgery team as well as ophthalmology for outpatient workup  of these findings.  Discussed this with patient and she voiced understanding.  Disposition:  I discussed the plan for discharge with the patient and/or their surrogate at bedside prior to discharge and they were in agreement with the plan and verbalized understanding of the return precautions provided. All questions answered to the best of my ability. Ultimately, the patient was discharged in stable condition with stable vital signs. I am reassured that they are capable of close follow up and good social support at home.   Clinical Impression:  1. Eye problem     Rx / DC Orders ED Discharge Orders     None       The plan for this patient was discussed with Dr. Silverio Lay, who voiced agreement and who oversaw evaluation and treatment of this patient.    Caron Presume, MD 04/13/23 Kristeen Mans    Charlynne Pander, MD 04/14/23 [phone]

## 2023-04-13 NOTE — ED Triage Notes (Signed)
 Pt came in via POV d/t an eye specialist recommending pt to come in for MRI d/t possible "Neurological nerve disease" (per pt). Pt reports back in November, 2024 she had swelling in her Rt eye & UC started her on steroids & ABT for shingles Tx, then her PCP gave her more steroids then her opthalmologic gave another round of steroids, ABT & eye gtts with encouragement to come into ED for eval & MRI. A/Ox4, no current pain, during triage her Rt eye is dilated from being at the eye Dr. Before arrival today.

## 2023-04-13 NOTE — ED Provider Notes (Signed)
 [place] EMERGENCY DEPARTMENT AT [facility] Provider Note   CSN: [id number] Arrival date & time: 04/13/23  0347     History  Chief Complaint  Patient presents with   Rt eye Vision problems    Tara Glover is a 68 y.o. female.  Patient with history of diabetes, asthma, carcinoid tumor presents today from ophthalmology for eye problem. States that symptoms began in November with drainage from her eye. Was initially diagnosed with bacterial conjunctivitis, treated with antibiotic ointment.  This did not improve her symptoms and therefore the beginning of December she saw ophthalmology who diagnosed her with shingles in her eye and prescribed her Valtrex and doxycycline.  States that this worked for a while but then returned.  She was then seen on January 30 and prescribed neomycin, doxycycline, and Valtrex as well as steroid eyedrops.  She states that her symptoms improved with the medication but then always return afterwards.  She went to ophthalmology earlier today for management of this and was found to have loss of external ocular movements throughout her right eye and was sent here for MRI imaging to further evaluate the etiology of this.  According to their notes which the patient comes in with, they will most concern for cavernous sinus pathology but could also be related to shingles in the past.  They also recommended a neurology consultation. Patient notes that she does have some numbness around her eye area, denies any other numbness. This has been stable since onset and is not progressing. She denies any changes to her vision but does note that she cannot really range her right eye.  The history is provided by the patient. No language interpreter was used.       Home Medications Prior to Admission medications   Medication Sig Start Date End Date Taking? Authorizing Provider  alendronate (FOSAMAX) 70 MG tablet Take 70 mg by mouth once a week. Take with a full glass of  water on an empty stomach.    [provider]  atorvastatin (LIPITOR) 40 MG tablet Take 1 tablet by mouth daily. 10/28/16   [provider]  Calcium Carbonate (CALCIUM 600 PO) Take by mouth daily.    [provider]  Cholecalciferol (VITAMIN D-3) 25 MCG (1000 UT) CAPS Take by mouth daily.    [provider]  Coenzyme Q10 (CO Q 10) 100 MG CAPS Take 100 mg by mouth daily.    [provider]  metFORMIN (GLUCOPHAGE-XR) 500 MG 24 hr tablet Take 500 mg by mouth daily with breakfast.    [provider]  Multiple Vitamin (MULTIVITAMIN) tablet Take 1 tablet by mouth daily.    [provider]      Allergies    Patient has no known allergies.    Review of Systems   Review of Systems  All other systems reviewed and are negative.   Physical Exam Updated Vital Signs BP (!) 149/73   Pulse 75   Temp 98.1 F (36.7 C) (Oral)   Resp 18   Ht 5\' 2"  (1.575 m)   Wt 86.2 kg   LMP 02/10/2002   SpO2 97%   BMI 34.75 kg/m  Physical Exam Vitals and nursing note reviewed.  Constitutional:      General: She is not in acute distress.    Appearance: Normal appearance. She is normal weight. She is not ill-appearing, toxic-appearing or diaphoretic.  HENT:     Head: Normocephalic and atraumatic.  Eyes:  Comments: Unable to fully look up, down, left, or right in the right eye with numbness surrounding the eye area. Proptosis noted as well. Eye is stained and dilated given patient was just seen at ophthalmology and therefore exam is limited in this regard. Vision is grossly intact  Left eye normal.  Cardiovascular:     Rate and Rhythm: Normal rate.  Pulmonary:     Effort: Pulmonary effort is normal. No respiratory distress.  Musculoskeletal:        General: Normal range of motion.     Cervical back: Normal range of motion.  Skin:    General: Skin is warm and dry.  Neurological:     General: No focal deficit present.     Mental Status: She  is alert and oriented to person, place, and time.     GCS: GCS eye subscore is 4. GCS verbal subscore is 5. GCS motor subscore is 6.     Sensory: Sensation is intact.     Motor: Motor function is intact.     Coordination: Coordination is intact.     Gait: Gait is intact.     Comments: Alert and oriented to self, place, time and event.    Speech is fluent, clear without dysarthria or dysphasia.    Strength 5/5 in upper/lower extremities   Sensation intact in upper/lower extremities   Psychiatric:        Mood and Affect: Mood normal.        Behavior: Behavior normal.     ED Results / Procedures / Treatments   Labs (all labs ordered are listed, but only abnormal results are displayed) Labs Reviewed  COMPREHENSIVE METABOLIC PANEL - Abnormal; Notable for the following components:      Result Value   Potassium 5.4 (*)    Glucose, Bld 129 (*)    All other components within normal limits  CBC    EKG None  Radiology No results found.  Procedures Procedures    Medications Ordered in ED Medications  gadobutrol (GADAVIST) 1 MMOL/ML injection 8 mL (8 mLs Intravenous Contrast Given 04/13/23 1405)    ED Course/ Medical Decision Making/ A&P Clinical Course as of 04/13/23 1613  Mon Apr 13, 2023  1528 Sent here from ophtho for diminished ROM of R eye going on since November. Optho concerned for cavernous sinus pathology [ ]  f/u MRI [ ]  consider neuro consult [RK]    Clinical Course User Index [RK] Caron Presume, MD                                 Medical Decision Making Amount and/or Complexity of Data Reviewed Labs: ordered. Radiology: ordered.  Risk Prescription drug management.   This patient is a 68 y.o. female who presents to the ED for concern of eye problem, this involves an extensive number of treatment options, and is a complaint that carries with it a high risk of complications and morbidity. The emergent differential diagnosis prior to evaluation includes,  but is not limited to, CVA, optic neuritis, sequela of shingles, Ocular ischemia, orbital cellulitis, sinusitis, neuralgia, migraine, acute angle closure glaucoma, eye trauma, uveitis, iritis, corneal abrasion/ulceration.   This is not an exhaustive differential.   Past Medical History / Co-morbidities / Social History:  has a past medical history of Asthma, Diabetes (HCC), Exercise-induced asthma, Fibroid of cervix, History of cardiac murmur as a child, History of concussion, History of malignant  carcinoid tumor (oncologist--  dr Lockie Mola), Hypercholesterolemia, Left ureteral stone, and Wears glasses.   Additional history: Chart reviewed. Pertinent results include: Patient seen by ophthalmology Dr. Despina Arias, paperwork present with the patient concern for sequela of shingles infection versus CNS pathology/involvement given complete loss of abduction, adduction, elevation, depression with V1 numbness concerning for CN III, IV, V, and VI with proptosis most concerning for cavernous sinus pathology  Physical Exam: Physical exam performed. The pertinent findings include: see above for detailed neuro exam. Briefly, patient unable to fully look up down, left or right with her left eye and has some surrounding numbness isolated around the orbit.  Otherwise alert and oriented and neurologically intact without focal deficits.  Lab Tests: I ordered, and personally interpreted labs.  The pertinent results include:  K 5.4, no other acute laboratory abnormalities   Imaging Studies: I ordered imaging studies including MRI brain and orbits with and without contrast.  Results are pending at shift change.   Disposition:  Patients MRI results are pending at shift change and will determine dispo. If negative, may need neurology consult. Otherwise, if negative, according to ophthalmology notes, symptoms could be related to sequela of shingles.   Care handoff to ED resident Freda Munro, MD at shift  change. Please see their note for continued evaluation and dispo.   Final Clinical Impression(s) / ED Diagnoses Final diagnoses:  None    Rx / DC Orders ED Discharge Orders     None         Vear Clock 04/14/23 1451    Margarita Grizzle, MD 04/18/23 1051
